# Patient Record
Sex: Female | Born: 1995 | Race: Black or African American | Hispanic: No | Marital: Single | Smoking: Former smoker
Health system: Southern US, Community
[De-identification: ages and names within clinical notes are randomized; demographics above are authoritative.]

## PROBLEM LIST (undated history)

## (undated) ENCOUNTER — Inpatient Hospital Stay (HOSPITAL_COMMUNITY): Payer: Self-pay

## (undated) DIAGNOSIS — A749 Chlamydial infection, unspecified: Secondary | ICD-10-CM

## (undated) DIAGNOSIS — K589 Irritable bowel syndrome without diarrhea: Secondary | ICD-10-CM

## (undated) DIAGNOSIS — J069 Acute upper respiratory infection, unspecified: Secondary | ICD-10-CM

## (undated) HISTORY — PX: NO PAST SURGERIES: SHX2092

---

## 2013-09-08 ENCOUNTER — Encounter (HOSPITAL_COMMUNITY): Payer: Self-pay | Admitting: Emergency Medicine

## 2013-09-08 ENCOUNTER — Emergency Department (HOSPITAL_COMMUNITY)
Admission: EM | Admit: 2013-09-08 | Discharge: 2013-09-09 | Disposition: A | Discharge status: Home / Self Care | Payer: PRIVATE HEALTH INSURANCE | Attending: Emergency Medicine | Admitting: Emergency Medicine

## 2013-09-08 DIAGNOSIS — R1110 Vomiting, unspecified: Secondary | ICD-10-CM

## 2013-09-08 DIAGNOSIS — R6883 Chills (without fever): Secondary | ICD-10-CM | POA: Insufficient documentation

## 2013-09-08 DIAGNOSIS — R197 Diarrhea, unspecified: Secondary | ICD-10-CM | POA: Insufficient documentation

## 2013-09-08 DIAGNOSIS — Z3202 Encounter for pregnancy test, result negative: Secondary | ICD-10-CM | POA: Insufficient documentation

## 2013-09-08 DIAGNOSIS — F17200 Nicotine dependence, unspecified, uncomplicated: Secondary | ICD-10-CM | POA: Insufficient documentation

## 2013-09-08 NOTE — ED Notes (Signed)
Pt states she woke up this morning feeling nauseated and having chills  Pt states she has been vomiting and is unable to hold anything down  Pt states she took a half of a phenergan and was unable to hold that down

## 2013-09-09 LAB — POCT PREGNANCY, URINE: Preg Test, Ur: NEGATIVE

## 2013-09-09 MED ORDER — ONDANSETRON 8 MG PO TBDP: ORAL_TABLET | ORAL | Status: DC

## 2013-09-09 MED ORDER — ONDANSETRON 8 MG PO TBDP
8.0000 mg | ORAL_TABLET | Freq: Once | ORAL | Status: AC
  Administered 2013-09-09: 8 mg via ORAL
  Filled 2013-09-09: qty 1

## 2013-09-09 NOTE — ED Provider Notes (Signed)
CSN: [id number]     Arrival date & time 09/08/13  2033 History   First MD Initiated Contact with Patient 09/09/13 0010     Chief Complaint  Patient presents with  . Emesis  . Chills   (Consider location/radiation/quality/duration/timing/severity/associated sxs/prior Treatment) Patient is a 18 y.o. female presenting with vomiting. The history is provided by the patient. No language interpreter was used.  Emesis Severity:  Moderate Duration:  1 day Timing:  Intermittent Quality:  Stomach contents Progression:  Unchanged Chronicity:  New Recent urination:  Normal Relieved by:  Nothing Worsened by:  Nothing tried Ineffective treatments:  None tried Associated symptoms: diarrhea   Risk factors: no alcohol use     History reviewed. No pertinent past medical history. History reviewed. No pertinent past surgical history. Family History  Problem Relation Age of Onset  . Cancer Other   . Stroke Other   . Diabetes Other   . Hypertension Other    History  Substance Use Topics  . Smoking status: Current Some Day Smoker    Types: Cigarettes  . Smokeless tobacco: Not on file  . Alcohol Use: No   OB History   Grav Para Term Preterm Abortions TAB SAB Ect Mult Living                 Review of Systems  Gastrointestinal: Positive for vomiting and diarrhea.  All other systems reviewed and are negative.    Allergies  Review of patient's allergies indicates no known allergies.  Home Medications   Current Outpatient Rx  Name  Route  Sig  Dispense  Refill  . acetaminophen (TYLENOL) 500 MG tablet   Oral   Take 500 mg by mouth every 6 (six) hours as needed for mild pain or moderate pain.         Marland Kitchen. ibuprofen (ADVIL,MOTRIN) 200 MG tablet   Oral   Take 200 mg by mouth every 6 (six) hours as needed for moderate pain.          BP 111/80  Pulse 101  Temp(Src) 97.9 F (36.6 C) (Oral)  Resp 18  Ht 5\' 5"  (1.651 m)  SpO2 100%  LMP 08/04/2013 Physical Exam  Constitutional:  She is oriented to person, place, and time. She appears well-developed and well-nourished. No distress.  HENT:  Head: Normocephalic and atraumatic.  Mouth/Throat: Oropharynx is clear and moist.  Eyes: Conjunctivae are normal. Pupils are equal, round, and reactive to light.  Neck: Normal range of motion. Neck supple.  Cardiovascular: Normal rate, regular rhythm and intact distal pulses.   Pulmonary/Chest: Effort normal and breath sounds normal. She has no wheezes. She has no rales.  Abdominal: Soft. Bowel sounds are increased. There is no tenderness. There is no rebound and no guarding.  Musculoskeletal: Normal range of motion.  Neurological: She is alert and oriented to person, place, and time.  Skin: Skin is warm and dry.  Psychiatric: She has a normal mood and affect.    ED Course  Procedures (including critical care time) Labs Review Labs Reviewed  POCT PREGNANCY, URINE   Imaging Review No results found.  EKG Interpretation   None       MDM  No diagnosis found. No further vomiting.  No surgical signs on abdominal exam.  Symptoms consistent with viral n/v/d.  Will treat symptomatically.  Return for fever abdominal pain or any concers.      Jasmine AweApril K Aleph Nickson-Rasch, MD 09/09/13 [phone]

## 2013-12-31 ENCOUNTER — Encounter (HOSPITAL_COMMUNITY): Payer: Self-pay | Admitting: Emergency Medicine

## 2013-12-31 DIAGNOSIS — R071 Chest pain on breathing: Secondary | ICD-10-CM | POA: Insufficient documentation

## 2013-12-31 DIAGNOSIS — R51 Headache: Secondary | ICD-10-CM | POA: Insufficient documentation

## 2013-12-31 DIAGNOSIS — M545 Low back pain, unspecified: Secondary | ICD-10-CM | POA: Insufficient documentation

## 2013-12-31 DIAGNOSIS — Z791 Long term (current) use of non-steroidal anti-inflammatories (NSAID): Secondary | ICD-10-CM | POA: Insufficient documentation

## 2013-12-31 DIAGNOSIS — Z3202 Encounter for pregnancy test, result negative: Secondary | ICD-10-CM | POA: Insufficient documentation

## 2013-12-31 DIAGNOSIS — Z79899 Other long term (current) drug therapy: Secondary | ICD-10-CM | POA: Insufficient documentation

## 2013-12-31 DIAGNOSIS — F17200 Nicotine dependence, unspecified, uncomplicated: Secondary | ICD-10-CM | POA: Insufficient documentation

## 2013-12-31 DIAGNOSIS — G8929 Other chronic pain: Secondary | ICD-10-CM | POA: Insufficient documentation

## 2013-12-31 LAB — CBC
HCT: 37.8 % (ref 36.0–46.0)
HEMOGLOBIN: 12.3 g/dL (ref 12.0–15.0)
MCH: 27.1 pg (ref 26.0–34.0)
MCHC: 32.5 g/dL (ref 30.0–36.0)
MCV: 83.3 fL (ref 78.0–100.0)
PLATELETS: 331 10*3/uL (ref 150–400)
RBC: 4.54 MIL/uL (ref 3.87–5.11)
RDW: 13 % (ref 11.5–15.5)
WBC: 8 10*3/uL (ref 4.0–10.5)

## 2013-12-31 LAB — URINALYSIS, ROUTINE W REFLEX MICROSCOPIC
GLUCOSE, UA: NEGATIVE mg/dL
Hgb urine dipstick: NEGATIVE
KETONES UR: 15 mg/dL — AB
NITRITE: NEGATIVE
PH: 6 (ref 5.0–8.0)
Protein, ur: NEGATIVE mg/dL
SPECIFIC GRAVITY, URINE: 1.03 (ref 1.005–1.030)
Urobilinogen, UA: 0.2 mg/dL (ref 0.0–1.0)

## 2013-12-31 LAB — BASIC METABOLIC PANEL
BUN: 7 mg/dL (ref 6–23)
CALCIUM: 8.9 mg/dL (ref 8.4–10.5)
CHLORIDE: 102 meq/L (ref 96–112)
CO2: 24 meq/L (ref 19–32)
Creatinine, Ser: 0.85 mg/dL (ref 0.50–1.10)
GFR calc Af Amer: >90 mL/min (ref >90)
GFR calc non Af Amer: >90 mL/min (ref >90)
GLUCOSE: 92 mg/dL (ref 70–99)
Potassium: 3.5 mEq/L — ABNORMAL LOW (ref 3.7–5.3)
Sodium: 139 mEq/L (ref 137–147)

## 2013-12-31 LAB — URINE MICROSCOPIC-ADD ON

## 2013-12-31 LAB — POC URINE PREG, ED: Preg Test, Ur: NEGATIVE

## 2013-12-31 NOTE — ED Notes (Signed)
Pt and family update on ED flow and process.

## 2013-12-31 NOTE — ED Notes (Signed)
Pt reports right sided flank pain x 1 week worsening. Pain with move, walk, deep inspiration, urination. Pt also reports sharp pains in stomach and headache.

## 2014-01-01 ENCOUNTER — Emergency Department (HOSPITAL_COMMUNITY): Payer: PRIVATE HEALTH INSURANCE

## 2014-01-01 ENCOUNTER — Emergency Department (HOSPITAL_COMMUNITY)
Admission: EM | Admit: 2014-01-01 | Discharge: 2014-01-01 | Disposition: A | Discharge status: Home / Self Care | Payer: PRIVATE HEALTH INSURANCE | Attending: Emergency Medicine | Admitting: Emergency Medicine

## 2014-01-01 DIAGNOSIS — R0789 Other chest pain: Secondary | ICD-10-CM

## 2014-01-01 MED ORDER — IBUPROFEN 800 MG PO TABS
800.0000 mg | ORAL_TABLET | Freq: Once | ORAL | Status: AC
  Administered 2014-01-01: 800 mg via ORAL
  Filled 2014-01-01: qty 1

## 2014-01-01 MED ORDER — OXYCODONE-ACETAMINOPHEN 5-325 MG PO TABS
1.0000 | ORAL_TABLET | Freq: Once | ORAL | Status: AC
  Administered 2014-01-01: 1 via ORAL
  Filled 2014-01-01: qty 1

## 2014-01-01 NOTE — ED Notes (Signed)
Pt reports she is having rt flank pain that comes when she moves, and deep inspiration. Pt states when she sits still she has no pain. Pt denies any injury. Pt requests her results not be discussed in front of family.

## 2014-01-01 NOTE — Discharge Instructions (Signed)
Take Ibuprofen 600-800 mg every 6-8 hours  Return to the emergency department if you develop any changing/worsening condition, coughing up blood, difficulty breathing, fever, abdominal pain, shortness of breath, repeated vomiting, or any other concerns (please read additional information regarding your condition below)   Chest Wall Pain Chest wall pain is pain felt in or around the chest bones and muscles. It may take up to 6 weeks to get better. It may take longer if you are active. Chest wall pain can happen on its own. Other times, things like germs, injury, coughing, or exercise can cause the pain. HOME CARE   Avoid activities that make you tired or cause pain. Try not to use your chest, belly (abdominal), or side muscles. Do not use heavy weights.  Put ice on the sore area.  Put ice in a plastic bag.  Place a towel between your skin and the bag.  Leave the ice on for 15-20 minutes for the first 2 days.  Only take medicine as told by your doctor. GET HELP RIGHT AWAY IF:   You have more pain or are very uncomfortable.  You have a fever.  Your chest pain gets worse.  You have new problems.  You feel sick to your stomach (nauseous) or throw up (vomit).  You start to sweat or feel lightheaded.  You have a cough with mucus (phlegm).  You cough up blood. MAKE SURE YOU:   Understand these instructions.  Will watch your condition.  Will get help right away if you are not doing well or get worse. Document Released: 02/07/2008 Document Revised: 11/13/2011 Document Reviewed: 04/17/2011 [facility] Patient Information 2014 [place].  Abdominal Pain, Adult Many things can cause abdominal pain. Usually, abdominal pain is not caused by a disease and will improve without treatment. It can often be observed and treated at home. Your health care provider will do a physical exam and possibly order blood tests and X-rays to help determine the seriousness of your pain. However, in  many cases, more time must pass before a clear cause of the pain can be found. Before that point, your health care provider may not know if you need more testing or further treatment. HOME CARE INSTRUCTIONS  Monitor your abdominal pain for any changes. The following actions may help to alleviate any discomfort you are experiencing:  Only take over-the-counter or prescription medicines as directed by your health care provider.  Do not take laxatives unless directed to do so by your health care provider.  Try a clear liquid diet (broth, tea, or water) as directed by your health care provider. Slowly move to a bland diet as tolerated. SEEK MEDICAL CARE IF:  You have unexplained abdominal pain.  You have abdominal pain associated with nausea or diarrhea.  You have pain when you urinate or have a bowel movement.  You experience abdominal pain that wakes you in the night.  You have abdominal pain that is worsened or improved by eating food.  You have abdominal pain that is worsened with eating fatty foods. SEEK IMMEDIATE MEDICAL CARE IF:   Your pain does not go away within 2 hours.  You have a fever.  You keep throwing up (vomiting).  Your pain is felt only in portions of the abdomen, such as the right side or the left lower portion of the abdomen.  You pass bloody or black tarry stools. MAKE SURE YOU:  Understand these instructions.   Will watch your condition.   Will get help  right away if you are not doing well or get worse.  Document Released: 05/31/2005 Document Revised: 06/11/2013 Document Reviewed: 04/30/2013 [facility] Patient Information 2014 [place], [place].   Emergency Department Resource Guide 1) Find a Doctor and Pay Out of Pocket Although you won't have to find out who is covered by your insurance plan, it is a good idea to ask around and get recommendations. You will then need to call the office and see if the doctor you have chosen will accept you as a new  patient and what types of options they offer for patients who are self-pay. Some doctors offer discounts or will set up payment plans for their patients who do not have insurance, but you will need to ask so you aren't surprised when you get to your appointment.  2) Contact Your Local Health Department Not all health departments have doctors that can see patients for sick visits, but many do, so it is worth a call to see if yours does. If you don't know where your local health department is, you can check in your phone book. The CDC also has a tool to help you locate your state's health department, and many state websites also have listings of all of their local health departments.  3) Find a Walk-in Clinic If your illness is not likely to be very severe or complicated, you may want to try a walk in clinic. These are popping up all over the country in pharmacies, drugstores, and shopping centers. They're usually staffed by nurse practitioners or physician assistants that have been trained to treat common illnesses and complaints. They're usually fairly quick and inexpensive. However, if you have serious medical issues or chronic medical problems, these are probably not your best option.  No Primary Care Doctor: - Call Health Connect at  [phone] - they can help you locate a primary care doctor that  accepts your insurance, provides certain services, etc. - Physician Referral Service- [phone]  Chronic Pain Problems: Organization         Address  Phone   Notes  [facility]  [phone] Patients need to be referred by their primary care doctor.   Medication Assistance: Organization         Address  Phone   Notes  [facility] Medication [facility] [address] [place] [place], [place] [address] [phone] --Must be a resident of [facility] -- Must have NO insurance coverage whatsoever (no Medicaid/ Medicare, etc.) -- The pt. MUST have a primary  care doctor that directs their care regularly and follows them in the community   MedAssist  [phone]   [employer]  [phone]    Agencies that provide inexpensive medical care: Organization         Address  Phone   Notes  [facility]  [phone]   [facility]    [phone]   [facility] [address] [place] [place], [place] [address] [phone]   [facility] [address] [place], [place] [phone]   Planned Parenthood    [phone]   [facility]    [phone]   [facility]  [address], [place] Phone:  [phone], Fax:  [phone] Hours of Operation:  9 am - 6 pm, M-F.  Also accepts Medicaid/Medicare and self-pay.  [facility]  [address],  [address], [place] Phone: [phone], Fax: [phone]. Hours of Operation:  8:30 am - 5:30 pm, M-F.  Also accepts Medicaid and self-pay.  [facility] [place] [address] [place] Phone: [phone]   [facility] [address] Natasha Bence [place], [place] [phone], Ext. [phone] Mondays & Thursdays: 7-9 AM.  First 15 patients are seen on a first come, first serve basis.    Medicaid-accepting [facility] Providers:  Organization         Address  Phone   Notes  [facility] [address], [place] [phone] Also accepts self-pay patients.  [facility] [address] Laurell [place], [place]  [phone]   [facility] [address], [place] [phone]   [facility] [address], [place] [phone]   Renaye Rakers [address], [place]   [phone] Only accepts [place] Access [place] patients after they have their name applied to their card.   Self-Pay (no insurance) in [facility]:  Organization         Address  Phone   Notes  Sickle Cell Patients, [facility] Internal Medicine [address] [place], [place] [phone]   [facility] [address] [place], [place] [phone]   [facility] [place]  [address], [place] [phone]   [facility]/Dr. Osei-Bonsu  [address] [place] or [address], [place] [phone] Phone number for both [place] and [place] locations is the same.  [facility] [address] [place] [phone]   [facility] [address] [place] or [address] [phone]   [facility] [address], [place] [phone], phone; [phone], fax Sees patients 1st and 3rd Saturday of every month.  Must not qualify for public or private insurance (i.e. Medicaid, Medicare, [place] Health Choice, Veterans' Benefits)  Household income should be no more than 200% of the poverty level The clinic cannot treat you if you are pregnant or think you are pregnant  Sexually transmitted diseases are not treated at the clinic.    Dental Care: Organization         Address  Phone  Notes  [facility] Department of [facility] [address] [place], [place] [phone] Accepts children up to age 62 who are enrolled in [place] or [place] Health Choice; pregnant women with a Medicaid card; and children who have applied for Medicaid or [place] Health Choice, but were declined, whose parents can pay a reduced fee at time of service.  [facility] Department of [facility]  [address], [place] [phone] Accepts children up to age 76 who are enrolled in [place] or [place] Health Choice; pregnant women with a Medicaid card; and children who have applied for Medicaid or [place] Health Choice, but were declined, whose parents can  pay a reduced fee at time of service.  [facility]  [address] [place], [place] [phone] Patients are seen by appointment only. Walk-ins are not accepted. [facility] will see patients 69 years of age and older. Monday - Tuesday (8am-5pm) Most Wednesdays (8:30-5pm) $30 per visit, cash only  [employer] [facility] [employer]  [address], [place] [phone] Patients are seen by appointment only. Walk-ins are not accepted. [facility] will see patients 20 years of age and older. One Wednesday Evening (Monthly: Volunteer Based).  $30 per visit, cash only  [employer] [facility]  [phone] for adults; Children under age 264, call Graduate Pediatric Dentistry at [phone]. Children aged 64-14, please call [phone] to request a pediatric application.  Dental services are provided in all areas of dental care including fillings, crowns and bridges, complete and partial dentures, implants, gum treatment, root canals, and extractions. Preventive care is also provided. Treatment is provided to both adults and children. Patients are selected via a lottery and there is often a waiting list.   [facility] [address], [place]  [phone] [email or web address]   [facility] [address], [place], [place] [phone], Ext. [phone] Second and Fourth Thursday of each month, opens at 6:30 AM; Clinic ends at 9 AM.  Patients are seen on a first-come first-served basis, and a limited number are seen during each clinic.   [facility]  [address], [place], [place] [phone]   Eligibility Requirements You must have lived in [place], [place], or [place] counties for at least the last three months.   You cannot be eligible for state or federal sponsored [place] Cityhealthcare insurance, including CIGNAVeterans Administration, [place]icaid, or [employer]Medicare.   You generally cannot be eligible for healthcare  insurance through your employer.    How to apply: Eligibility screenings are held every Tuesday and Wednesday afternoon from 1:00 pm until 4:00 pm. You do not need an appointment for the interview!  [facility] [address], [place], [place] [phone]   [facility]  [phone]   [facility]  [phone]   [facility]  [phone]    Behavioral Health Resources in the Community: Intensive Outpatient Programs Organization         Address  Phone  Notes  [facility] [address], [place], [place] [phone]   [facility] [address], [place], [place] [phone]   [facility] [address], [place], [place]  [phone]   [facility] [address],  [place], [place] [phone] or [phone]   Substance Abuse Resources Organization         Address  Phone  Notes  [facility]  [phone]   [facility]  [phone]   [facility]  [phone]   [facility]  [phone]   Residential & Outpatient Substance Abuse Program  [phone]   Psychological Services Organization         Address  Phone  Notes  [facility]  [phone]- [phone]   [facility]  [phone]- [phone]   [facility] [address], [place] [phone] or [phone]    Mobile Crisis Teams Organization         Address  Phone  Notes  [facility], Mobile Crisis Care Unit  [phone]   [facility]  [address] [place], [place] [phone]   Doristine LocksSharon DeEsch [address] [place] [phone]    Self-Help/Support Groups Organization         Address  Phone             Notes  [facility] [place] -  variety of support groups  [phone] Call for more information  Narcotics Anonymous (NA),  [facility] [address], [employer] [place]  2 meetings at this location   Residential Sports administrator         Address  Phone  Notes  [facility] [address],    [place]  [phone]   [facility]  [address] [place] [address], [place], [place] [phone]   [facility] [address] [place], [place] [phone] Admissions: 8am-3pm M-F  [facility] [address]    [place], [place] [phone]   [facility] [address] [place], [place], [place] [phone]   [facility] [address],  [place], [place] [phone]   [facility] - Intensive Outpatient [address]urell Joseph[address], [place], [place] [phone]   [facility] ([facility] [address] [place]  [place], [place] [phone] or [phone]   [facility] ([facility]) [address] [place], [place] [phone] Accepts Medicaid  [facility] [address]  [place] [phone] Substance Abuse/Addiction Treatment   [facility] Organization         Address  Phone  Notes  [facility]  [phone]   Angie Fava, PhD [address] Ervin Knack [place], [place]   [phone] or [phone]   [facility] Behavioral   [address] [place], [place] [phone]   [facility] [address] [place], [place] [phone] Insurance/Medicaid/sponsorship through [facility] and Families [address]                                    [place], [place] [phone] Therapy/tele-psych/case  [facility] [address], [place] [phone]    Dr. Lolly Mustache  [phone]   [facility]  [employer] [facility] Dept. 1) [address], [place] 2) [address], [place] 3)  [address], [place] [phone]  [phone]    [facility] Child Abuse Hotline ([phone] or [phone] (After Hours)

## 2014-01-01 NOTE — ED Provider Notes (Signed)
CSN: [id number]     Arrival date & time 12/31/13  1850 History   First MD Initiated Contact with Patient 01/01/14 0038     Chief Complaint  Patient presents with  . Flank Pain   HPI  Barbara Mclean is a 18 y.o. female with no PMH who presents to the ED for evaluation of flank pain. History was provided by the patient. Patient states that she has had right flank pain for the past week. Her pain is located in her upper right flank without radiation. She has no pain at rest. Her pain is exacerbated with movement, deep breathing, and palpation. She did not take anything for pain prior to arrival. She's never had similar pain in the past. No injuries or trauma. She denies any cough, shortness of breath, or chest pain. No history of DVT, PE, clotting disorder, cancer, recent surgery, immobilization, travel, or estrogen supplementation. She also has had intermittent episodes of sharp pain throughout her abdomen, however, denies any abdominal pain currently. She states she has these sharp pains before urination, but denies dysuria. She denies any vaginal bleeding, vaginal discharge, or hematuria. Patient is currently sexually active with no new partners or concerns for STD's. She has chronic lower back pain with no acute changes. She has had alternating chills and feeling warm, but denies any fevers. She also complains of a generalized headache which started during her ED visit. No vision changes, neck pain, weakness, loss of sensation, numbness/tinling.    History reviewed. No pertinent past medical history. History reviewed. No pertinent past surgical history. Family History  Problem Relation Age of Onset  . Cancer Other   . Stroke Other   . Diabetes Other   . Hypertension Other    History  Substance Use Topics  . Smoking status: Current Some Day Smoker -- 0.50 packs/day    Types: Cigarettes  . Smokeless tobacco: Not on file  . Alcohol Use: No   OB History   Grav Para Term Preterm Abortions  TAB SAB Ect Mult Living                 Review of Systems  Constitutional: Negative for fever, chills, diaphoresis, activity change, appetite change and fatigue.  HENT: Negative for congestion, rhinorrhea and sore throat.   Eyes: Negative for photophobia and visual disturbance.  Respiratory: Negative for cough, chest tightness, shortness of breath and wheezing.   Gastrointestinal: Positive for abdominal pain. Negative for nausea, vomiting, diarrhea and constipation.  Genitourinary: Negative for dysuria, vaginal bleeding, vaginal discharge, difficulty urinating, genital sores, vaginal pain, menstrual problem and pelvic pain.  Musculoskeletal: Positive for back pain (chronic). Negative for myalgias and neck pain.  Neurological: Positive for headaches. Negative for dizziness, weakness, light-headedness and numbness.    Allergies  Review of patient's allergies indicates no known allergies.  Home Medications   Prior to Admission medications   Medication Sig Start Date End Date Taking? Authorizing Provider  acetaminophen (TYLENOL) 500 MG tablet Take 500 mg by mouth every 6 (six) hours as needed for mild pain or moderate pain.   Yes Historical Provider, MD  ibuprofen (ADVIL,MOTRIN) 200 MG tablet Take 200 mg by mouth every 6 (six) hours as needed for moderate pain.   Yes Historical Provider, MD   BP 135/88  Pulse 98  Temp(Src) 98.3 F (36.8 C) (Oral)  Resp 16  Ht 5\' 5"  (1.651 m)  Wt 111 lb (50.349 kg)  BMI 18.47 kg/m2  SpO2 100%  LMP 12/21/2013  Filed Vitals:  01/01/14 0131 01/01/14 0232 01/01/14 0233 01/01/14 0254  BP: 120/83 109/72  106/69  Pulse: 80  75 74  Temp: 98.6 F (37 C)   97.7 F (36.5 C)  TempSrc: Oral   Oral  Resp: 18   18  Height:      Weight:      SpO2: 98%  98% 100%    Physical Exam  Nursing note and vitals reviewed. Constitutional: She is oriented to person, place, and time. She appears well-developed and well-nourished. No distress.  HENT:  Head:  Normocephalic and atraumatic.  Right Ear: External ear normal.  Left Ear: External ear normal.  Nose: Nose normal.  Mouth/Throat: Oropharynx is clear and moist. No oropharyngeal exudate.  Eyes: Conjunctivae are normal. Right eye exhibits no discharge. Left eye exhibits no discharge.  Neck: Normal range of motion. Neck supple.  Cardiovascular: Normal rate, regular rhythm, normal heart sounds and intact distal pulses.  Exam reveals no gallop and no friction rub.   No murmur heard. Pulmonary/Chest: Effort normal and breath sounds normal. No respiratory distress. She has no wheezes. She has no rales. She exhibits tenderness.    Focal tenderness to palpation to the right lateral chest wall with no edema, erythema, ecchymosis, crepitus, or lesions. Pain worse with deep breathing and sitting up for exam.   Abdominal: Soft. Bowel sounds are normal. She exhibits no distension and no mass. There is no tenderness. There is no rebound and no guarding.  Musculoskeletal: Normal range of motion. She exhibits no edema and no tenderness.  No LE edema or calf tenderness bilaterally. No tenderness to palpation to the thoracic or lumbar spinous processes throughout.  No tenderness to palpation to the paraspinal muscles throughout. Patient moving all extremities throughout exam. Patient able to ambulate without difficulty or ataxia  Neurological: She is alert and oriented to person, place, and time.  GCS 15.  No focal neurological deficits.  CN 2-12 intact.   Skin: Skin is warm and dry. She is not diaphoretic.    ED Course  Procedures (including critical care time) Labs Review Labs Reviewed  URINALYSIS, ROUTINE W REFLEX MICROSCOPIC - Abnormal; Notable for the following:    Color, Urine AMBER (*)    APPearance CLOUDY (*)    Bilirubin Urine SMALL (*)    Ketones, ur 15 (*)    Leukocytes, UA SMALL (*)    All other components within normal limits  BASIC METABOLIC PANEL - Abnormal; Notable for the following:     Potassium 3.5 (*)    All other components within normal limits  URINE MICROSCOPIC-ADD ON - Abnormal; Notable for the following:    Squamous Epithelial / LPF FEW (*)    All other components within normal limits  CBC  POC URINE PREG, ED    Imaging Review Dg Chest 2 View  01/01/2014   CLINICAL DATA:  Right upper flank pain for 1 week.  EXAM: CHEST  2 VIEW  COMPARISON:  None.  FINDINGS: The lungs are well-aerated and clear. There is no evidence of focal opacification, pleural effusion or pneumothorax.  The heart is normal in size; the mediastinal contour is within normal limits. No acute osseous abnormalities are seen.  IMPRESSION: No acute cardiopulmonary process seen.   Electronically Signed   By: Roanna RaiderJeffery  Chang M.D.   On: 01/01/2014 01:07     EKG Interpretation None      Results for orders placed during the hospital encounter of 01/01/14  URINALYSIS, ROUTINE W REFLEX MICROSCOPIC  Result Value Ref Range   Color, Urine AMBER (*) YELLOW   APPearance CLOUDY (*) CLEAR   Specific Gravity, Urine 1.030  1.005 - 1.030   pH 6.0  5.0 - 8.0   Glucose, UA NEGATIVE  NEGATIVE mg/dL   Hgb urine dipstick NEGATIVE  NEGATIVE   Bilirubin Urine SMALL (*) NEGATIVE   Ketones, ur 15 (*) NEGATIVE mg/dL   Protein, ur NEGATIVE  NEGATIVE mg/dL   Urobilinogen, UA 0.2  0.0 - 1.0 mg/dL   Nitrite NEGATIVE  NEGATIVE   Leukocytes, UA SMALL (*) NEGATIVE  CBC      Result Value Ref Range   WBC 8.0  4.0 - 10.5 K/uL   RBC 4.54  3.87 - 5.11 MIL/uL   Hemoglobin 12.3  12.0 - 15.0 g/dL   HCT 04.5  40.9 - 81.1 %   MCV 83.3  78.0 - 100.0 fL   MCH 27.1  26.0 - 34.0 pg   MCHC 32.5  30.0 - 36.0 g/dL   RDW 91.4  78.2 - 95.6 %   Platelets 331  150 - 400 K/uL  BASIC METABOLIC PANEL      Result Value Ref Range   Sodium 139  137 - 147 mEq/L   Potassium 3.5 (*) 3.7 - 5.3 mEq/L   Chloride 102  96 - 112 mEq/L   CO2 24  19 - 32 mEq/L   Glucose, Bld 92  70 - 99 mg/dL   BUN 7  6 - 23 mg/dL   Creatinine, Ser 2.13   0.50 - 1.10 mg/dL   Calcium 8.9  8.4 - 08.6 mg/dL   GFR calc non Af Amer >90  >90 mL/min   GFR calc Af Amer >90  >90 mL/min  URINE MICROSCOPIC-ADD ON      Result Value Ref Range   Squamous Epithelial / LPF FEW (*) RARE   WBC, UA 3-6  <3 WBC/hpf   RBC / HPF 0-2  <3 RBC/hpf   Bacteria, UA RARE  RARE   Urine-Other MUCOUS PRESENT    POC URINE PREG, ED      Result Value Ref Range   Preg Test, Ur NEGATIVE  NEGATIVE     MDM   Barbara Mclean is a 18 y.o. female with no PMH who presents to the ED for evaluation of flank pain, abdominal pain and headache.   Rechecks  1:40 AM = Pain not improved. Ordering 800 mg Ibuprofen. Repeat abdominal exam benign. No RUQ, flank or abdominal tenderness.  2:05 AM = Pain improved to a 5/10. Pain only worse "when I move, bend, or push on it." Headache resolved.  2:50 AM = Pain resolved. No distress. No headache or abdominal pain.    Etiology of flank pain likely due to to a chest wall pain. Pain has focal reproducible right rib pain. No history of injuries or trauma. No concerning signs of injury on exam. Chest x-ray negative for an acute cardiopulmonary process or rib fracture. Patient does have pain to the right rib area with deep breathing, however, I do not believe this is a pulmonary embolism. PERC negative. No evidence of DVT on exam. Patient had complete resolution of her symptoms throughout her ED visit. No hypoxia, respiratory distress, or tachypnea. Lungs clear to auscultation.    Patient states she has also had intermittent episodes of sharp pains throughout her abdomen. No abdominal pain throughout ED visit. No abdominal tenderness on exam. Abdominal exam benign. Vital signs stable. Headache also resolved. No neurological deficits.  Instructed to follow-up with PCP for continued evaluation and management. Return precautions, discharge instructions, and follow-up was discussed with the patient before discharge.     Discharge Medication List as of  01/01/2014  2:46 AM       Final impressions: 1. Right-sided chest wall pain      Luiz IronJessica Katlin Cheryl Stabenow PA-C   This patient was discussed with Dr. Robyn Haberampos           Heyden Jaber K Lanny Lipkin, PA-C 01/01/14 2317

## 2014-01-02 NOTE — ED Provider Notes (Signed)
Medical screening examination/treatment/procedure(s) were performed by non-physician practitioner and as supervising physician I was immediately available for consultation/collaboration.   EKG Interpretation None        Lyanne CoKevin M Jovanni Rash, MD 01/02/14 [phone]

## 2014-02-05 ENCOUNTER — Encounter: Payer: PRIVATE HEALTH INSURANCE | Admitting: Gastroenterology

## 2014-02-05 ENCOUNTER — Telehealth: Payer: Self-pay | Admitting: Gastroenterology

## 2014-02-05 NOTE — Progress Notes (Signed)
ERROR

## 2014-02-05 NOTE — Telephone Encounter (Signed)
Pt was a no show

## 2014-02-05 NOTE — Telephone Encounter (Signed)
REVIEWED.  

## 2014-02-05 NOTE — Progress Notes (Signed)
   Subjective:    Patient ID: Barbara Mclean, female    DOB: May 08, 1996, 18 y.o.   MRN: [id number] Default, Provider, MD   HPI  No past medical history on file.    Review of Systems     Objective:   Physical Exam        Assessment & Plan:

## 2014-09-04 NOTE — L&D Delivery Note (Signed)
   Delivery Note After reaching complete dilatation and effacement, baby had variable decelerations. We were called to room to assess and patient was +2/+3 station and favorable to push. Baby had fetal bradycardia down to 90 with good variability throughout and intermittent return to baseline. Mother pushed 5 times prior to delivery and baby had nuchal x1, easily reduced at the perineum. Apgars were reassuring and baby was not in distress after delivery.   At 4:30 PM a viable and healthy female was delivered via Vaginal, Spontaneous Delivery (Presentation: ; Occiput Anterior).  APGAR: 8, 9; weight pending  .   Placenta status: Intact, Spontaneous.  Cord: 3 vessels with the following complications: Loose nuchal x1, easily reduced.  Cord pH: N/A  Anesthesia: Epidural  Episiotomy: None Lacerations: Sulcus;Labial Suture Repair: 3.0 vicryl Est. Blood Loss (mL): 161  Mom to postpartum.  Baby to Couplet care / Skin to Skin.  De Hollingshead 04/12/2015, 5:04 PM   OB fellow attestation: Patient is a G1P1001 at [redacted]w[redacted]d who was admitted in active labor with SROM, significant hx of CT and [place] during this pregnancy  I was gloved and present for delivery in its entirety and began initial pushing efforts with the patient.  Second stage of labor progressed, baby delivered after 5 contractions.  Fetal bradycardia was noted during the second stage per above and vacuum was discussed but the patient effectively pushed and infant was delivered quickly. Infant had terminal meconium on delivery.  Complications: none  Lacerations: Right sulcus/labial, repaired with 3-0 vicryl  EBL: 161  Federico Flake, MD 5:56 PM

## 2014-09-21 ENCOUNTER — Other Ambulatory Visit: Payer: Self-pay | Admitting: Obstetrics and Gynecology

## 2014-09-21 DIAGNOSIS — O3680X0 Pregnancy with inconclusive fetal viability, not applicable or unspecified: Secondary | ICD-10-CM

## 2014-09-23 ENCOUNTER — Other Ambulatory Visit: Payer: Self-pay | Admitting: Obstetrics and Gynecology

## 2014-09-23 ENCOUNTER — Encounter: Payer: Self-pay | Admitting: *Deleted

## 2014-09-23 ENCOUNTER — Ambulatory Visit (INDEPENDENT_AMBULATORY_CARE_PROVIDER_SITE_OTHER): Payer: Medicaid Other

## 2014-09-23 DIAGNOSIS — O26841 Uterine size-date discrepancy, first trimester: Secondary | ICD-10-CM

## 2014-09-23 DIAGNOSIS — O3680X0 Pregnancy with inconclusive fetal viability, not applicable or unspecified: Secondary | ICD-10-CM

## 2014-09-23 NOTE — Progress Notes (Signed)
U/S-single IUP with +FCA noted, FHR-161 bpm, active fetus, CRL c/w 12+0wks EDD 04/07/2015, cx appears closed, bilateral adnexa appears WNL, Pt offered NT/IT and is unsure at this time informed patient that we would need to complete the  Screening within the next week if she desires to have the NT/IT

## 2014-09-29 ENCOUNTER — Telehealth: Payer: Self-pay | Admitting: Women's Health

## 2014-09-29 NOTE — Telephone Encounter (Signed)
Pt requesting Integrated screening, pt scheduled for Friday, January 29,2015 per Rodney Boozeasha, Ultrasound Sonographer.

## 2014-09-30 ENCOUNTER — Other Ambulatory Visit: Payer: Self-pay | Admitting: Obstetrics & Gynecology

## 2014-09-30 DIAGNOSIS — Z3682 Encounter for antenatal screening for nuchal translucency: Secondary | ICD-10-CM

## 2014-10-02 ENCOUNTER — Ambulatory Visit (INDEPENDENT_AMBULATORY_CARE_PROVIDER_SITE_OTHER): Payer: Medicaid Other

## 2014-10-02 ENCOUNTER — Other Ambulatory Visit: Payer: Self-pay

## 2014-10-02 DIAGNOSIS — Z3401 Encounter for supervision of normal first pregnancy, first trimester: Secondary | ICD-10-CM

## 2014-10-02 DIAGNOSIS — Z3682 Encounter for antenatal screening for nuchal translucency: Secondary | ICD-10-CM

## 2014-10-02 DIAGNOSIS — Z36 Encounter for antenatal screening of mother: Secondary | ICD-10-CM

## 2014-10-02 NOTE — Progress Notes (Signed)
U/S(13+2wks)-single IUP with +FCA noted, CRL c/w dates, posterior Gr 0 placenta, cx appears closed, bilateral adnexa appears WNL, FHR- 153 bpm, NB present, NT-1.417mm

## 2014-10-05 LAB — MATERNAL SCREEN, INTEGRATED #1
Crown Rump Length: 70.4 mm
GEST. AGE ON COLLECTION DATE: 13 wk
Maternal Age at EDD: 21.5 years
NUMBER OF FETUSES: 1
Nuchal Translucency (NT): 1.7 mm
PAPP-A Value: 803.5 ng/mL
Weight: 122 [lb_av]

## 2014-10-07 ENCOUNTER — Encounter: Payer: Self-pay | Admitting: Advanced Practice Midwife

## 2014-10-07 ENCOUNTER — Ambulatory Visit (INDEPENDENT_AMBULATORY_CARE_PROVIDER_SITE_OTHER): Payer: Medicaid Other | Admitting: Advanced Practice Midwife

## 2014-10-07 VITALS — BP 100/60 | Wt 122.0 lb

## 2014-10-07 DIAGNOSIS — Z118 Encounter for screening for other infectious and parasitic diseases: Secondary | ICD-10-CM

## 2014-10-07 DIAGNOSIS — Z113 Encounter for screening for infections with a predominantly sexual mode of transmission: Secondary | ICD-10-CM

## 2014-10-07 DIAGNOSIS — Z331 Pregnant state, incidental: Secondary | ICD-10-CM

## 2014-10-07 DIAGNOSIS — Z1389 Encounter for screening for other disorder: Secondary | ICD-10-CM

## 2014-10-07 DIAGNOSIS — Z114 Encounter for screening for human immunodeficiency virus [HIV]: Secondary | ICD-10-CM

## 2014-10-07 DIAGNOSIS — Z1159 Encounter for screening for other viral diseases: Secondary | ICD-10-CM

## 2014-10-07 DIAGNOSIS — Z130 Encounter for screening for diseases of the blood and blood-forming organs and certain disorders involving the immune mechanism: Secondary | ICD-10-CM

## 2014-10-07 DIAGNOSIS — O98819 Other maternal infectious and parasitic diseases complicating pregnancy, unspecified trimester: Secondary | ICD-10-CM

## 2014-10-07 DIAGNOSIS — Z3401 Encounter for supervision of normal first pregnancy, first trimester: Secondary | ICD-10-CM

## 2014-10-07 DIAGNOSIS — A749 Chlamydial infection, unspecified: Secondary | ICD-10-CM

## 2014-10-07 DIAGNOSIS — Z0283 Encounter for blood-alcohol and blood-drug test: Secondary | ICD-10-CM

## 2014-10-07 DIAGNOSIS — Z0184 Encounter for antibody response examination: Secondary | ICD-10-CM

## 2014-10-07 DIAGNOSIS — Z1371 Encounter for nonprocreative screening for genetic disease carrier status: Secondary | ICD-10-CM

## 2014-10-07 LAB — OB RESULTS CONSOLE RUBELLA ANTIBODY, IGM: Rubella: IMMUNE

## 2014-10-07 LAB — POCT URINALYSIS DIPSTICK
Blood, UA: NEGATIVE
GLUCOSE UA: NEGATIVE
KETONES UA: NEGATIVE
NITRITE UA: NEGATIVE
PROTEIN UA: NEGATIVE

## 2014-10-07 LAB — OB RESULTS CONSOLE RPR: RPR: NONREACTIVE

## 2014-10-07 LAB — OB RESULTS CONSOLE GC/CHLAMYDIA
Chlamydia: POSITIVE
Gonorrhea: NEGATIVE

## 2014-10-07 LAB — OB RESULTS CONSOLE ABO/RH: RH Type: POSITIVE

## 2014-10-07 NOTE — Progress Notes (Signed)
  Subjective:    Barbara Mclean is a G1P0 5469w0d being seen today for her first obstetrical visit.  Her obstetrical history is significant for none.  Pregnancy history fully reviewed.  Patient reports nausea, getting better. Working at [employer].    There were no vitals filed for this visit.  HISTORY: OB History  Gravida Para Term Preterm AB SAB TAB Ectopic Multiple Living  1             # Outcome Date GA Lbr Len/2nd Weight Sex Delivery Anes PTL Lv  1 Current              No past medical history on file. Past Surgical History  Procedure Laterality Date  . No past surgeries     Family History  Problem Relation Age of Onset  . Cancer Other   . Stroke Other   . Diabetes Other   . Hypertension Other      Exam                                           Skin: normal coloration and turgor, no rashes    Neurologic: oriented, normal, normal mood   Extremities: normal strength, tone, and muscle mass   HEENT PERRLA   Mouth/Teeth mucous membranes moist, normal dentition   Neck supple and no masses   Cardiovascular: regular rate and rhythm   Respiratory:  appears well, vitals normal, no respiratory distress, acyanotic   Abdomen: soft, non-tender;  FHR: 150          Assessment:    Pregnancy: G1P0 Patient Active Problem List   Diagnosis Date Noted  . Supervision of normal first pregnancy 10/07/2014        Plan:     Initial labs drawn. Continue prenatal vitamins  Problem list reviewed and updated  Reviewed n/v relief measures and warning s/s to report  Reviewed recommended weight gain based on pre-gravid BMI  Encouraged well-balanced diet Genetic Screening discussed Integrated Screen: requested.  Ultrasound discussed; fetal survey: requested.  Follow up in 3 weeks for Pap smear and 2nd IT.  CRESENZO-DISHMAN,Karuna Balducci 10/07/2014

## 2014-10-07 NOTE — Patient Instructions (Signed)
First Trimester of Pregnancy The first trimester of pregnancy is from week 1 until the end of week 12 (months 1 through 3). A week after a sperm fertilizes an egg, the egg will implant on the wall of the uterus. This embryo will begin to develop into a baby. Genes from you and your partner are forming the baby. The female genes determine whether the baby is a boy or a girl. At 6-8 weeks, the eyes and face are formed, and the heartbeat can be seen on ultrasound. At the end of 12 weeks, all the baby's organs are formed.  Now that you are pregnant, you will want to do everything you can to have a healthy baby. Two of the most important things are to get good prenatal care and to follow your health care provider's instructions. Prenatal care is all the medical care you receive before the baby's birth. This care will help prevent, find, and treat any problems during the pregnancy and childbirth. BODY CHANGES Your body goes through many changes during pregnancy. The changes vary from woman to woman.   You may gain or lose a couple of pounds at first.  You may feel sick to your stomach (nauseous) and throw up (vomit). If the vomiting is uncontrollable, call your health care provider.  You may tire easily.  You may develop headaches that can be relieved by medicines approved by your health care provider.  You may urinate more often. Painful urination may mean you have a bladder infection.  You may develop heartburn as a result of your pregnancy.  You may develop constipation because certain hormones are causing the muscles that push waste through your intestines to slow down.  You may develop hemorrhoids or swollen, bulging veins (varicose veins).  Your breasts may begin to grow larger and become tender. Your nipples may stick out more, and the tissue that surrounds them (areola) may become darker.  Your gums may bleed and may be sensitive to brushing and flossing.  Dark spots or blotches (chloasma,  mask of pregnancy) may develop on your face. This will likely fade after the baby is born.  Your menstrual periods will stop.  You may have a loss of appetite.  You may develop cravings for certain kinds of food.  You may have changes in your emotions from day to day, such as being excited to be pregnant or being concerned that something may go wrong with the pregnancy and baby.  You may have more vivid and strange dreams.  You may have changes in your hair. These can include thickening of your hair, rapid growth, and changes in texture. Some women also have hair loss during or after pregnancy, or hair that feels dry or thin. Your hair will most likely return to normal after your baby is born. WHAT TO EXPECT AT YOUR PRENATAL VISITS During a routine prenatal visit:  You will be weighed to make sure you and the baby are growing normally.  Your blood pressure will be taken.  Your abdomen will be measured to track your baby's growth.  The fetal heartbeat will be listened to starting around week 10 or 12 of your pregnancy.  Test results from any previous visits will be discussed. Your health care provider may ask you:  How you are feeling.  If you are feeling the baby move.  If you have had any abnormal symptoms, such as leaking fluid, bleeding, severe headaches, or abdominal cramping.  If you have any questions. Other tests   that may be performed during your first trimester include:  Blood tests to find your blood type and to check for the presence of any previous infections. They will also be used to check for low iron levels (anemia) and Rh antibodies. Later in the pregnancy, blood tests for diabetes will be done along with other tests if problems develop.  Urine tests to check for infections, diabetes, or protein in the urine.  An ultrasound to confirm the proper growth and development of the baby.  An amniocentesis to check for possible genetic problems.  Fetal screens for  spina bifida and Down syndrome.  You may need other tests to make sure you and the baby are doing well. HOME CARE INSTRUCTIONS  Medicines  Follow your health care provider's instructions regarding medicine use. Specific medicines may be either safe or unsafe to take during pregnancy.  Take your prenatal vitamins as directed.  If you develop constipation, try taking a stool softener if your health care provider approves. Diet  Eat regular, well-balanced meals. Choose a variety of foods, such as meat or vegetable-based protein, fish, milk and low-fat dairy products, vegetables, fruits, and whole grain breads and cereals. Your health care provider will help you determine the amount of weight gain that is right for you.  Avoid raw meat and uncooked cheese. These carry germs that can cause birth defects in the baby.  Eating four or five small meals rather than three large meals a day may help relieve nausea and vomiting. If you start to feel nauseous, eating a few soda crackers can be helpful. Drinking liquids between meals instead of during meals also seems to help nausea and vomiting.  If you develop constipation, eat more high-fiber foods, such as fresh vegetables or fruit and whole grains. Drink enough fluids to keep your urine clear or pale yellow. Activity and Exercise  Exercise only as directed by your health care provider. Exercising will help you:  Control your weight.  Stay in shape.  Be prepared for labor and delivery.  Experiencing pain or cramping in the lower abdomen or low back is a good sign that you should stop exercising. Check with your health care provider before continuing normal exercises.  Try to avoid standing for long periods of time. Move your legs often if you must stand in one place for a long time.  Avoid heavy lifting.  Wear low-heeled shoes, and practice good posture.  You may continue to have sex unless your health care provider directs you  otherwise. Relief of Pain or Discomfort  Wear a good support bra for breast tenderness.   Take warm sitz baths to soothe any pain or discomfort caused by hemorrhoids. Use hemorrhoid cream if your health care provider approves.   Rest with your legs elevated if you have leg cramps or low back pain.  If you develop varicose veins in your legs, wear support hose. Elevate your feet for 15 minutes, 3-4 times a day. Limit salt in your diet. Prenatal Care  Schedule your prenatal visits by the twelfth week of pregnancy. They are usually scheduled monthly at first, then more often in the last 2 months before delivery.  Write down your questions. Take them to your prenatal visits.  Keep all your prenatal visits as directed by your health care provider. Safety  Wear your seat belt at all times when driving.  Make a list of emergency phone numbers, including numbers for family, friends, the hospital, and police and fire departments. General Tips    Ask your health care provider for a referral to a local prenatal education class. Begin classes no later than at the beginning of month 6 of your pregnancy.  Ask for help if you have counseling or nutritional needs during pregnancy. Your health care provider can offer advice or refer you to specialists for help with various needs.  Do not use hot tubs, steam rooms, or saunas.  Do not douche or use tampons or scented sanitary pads.  Do not cross your legs for long periods of time.  Avoid cat litter boxes and soil used by cats. These carry germs that can cause birth defects in the baby and possibly loss of the fetus by miscarriage or stillbirth.  Avoid all smoking, herbs, alcohol, and medicines not prescribed by your health care provider. Chemicals in these affect the formation and growth of the baby.  Schedule a dentist appointment. At home, brush your teeth with a soft toothbrush and be gentle when you floss. SEEK MEDICAL CARE IF:   You have  dizziness.  You have mild pelvic cramps, pelvic pressure, or nagging pain in the abdominal area.  You have persistent nausea, vomiting, or diarrhea.  You have a bad smelling vaginal discharge.  You have pain with urination.  You notice increased swelling in your face, hands, legs, or ankles. SEEK IMMEDIATE MEDICAL CARE IF:   You have a fever.  You are leaking fluid from your vagina.  You have spotting or bleeding from your vagina.  You have severe abdominal cramping or pain.  You have rapid weight gain or loss.  You vomit blood or material that looks like coffee grounds.  You are exposed to German measles and have never had them.  You are exposed to fifth disease or chickenpox.  You develop a severe headache.  You have shortness of breath.  You have any kind of trauma, such as from a fall or a car accident. Document Released: 08/15/2001 Document Revised: 01/05/2014 Document Reviewed: 07/01/2013 [employer] Patient Information 2015 [employer]. This information is not intended to replace advice given to you by your health care provider. Make sure you discuss any questions you have with your health care provider.  

## 2014-10-09 LAB — GC/CHLAMYDIA PROBE AMP
Chlamydia trachomatis, NAA: POSITIVE — AB
Neisseria gonorrhoeae by PCR: NEGATIVE

## 2014-10-09 LAB — URINE CULTURE: Organism ID, Bacteria: NO GROWTH

## 2014-10-13 DIAGNOSIS — O98819 Other maternal infectious and parasitic diseases complicating pregnancy, unspecified trimester: Secondary | ICD-10-CM

## 2014-10-13 DIAGNOSIS — A749 Chlamydial infection, unspecified: Secondary | ICD-10-CM | POA: Insufficient documentation

## 2014-10-13 MED ORDER — AZITHROMYCIN 500 MG PO TABS: 1000.0000 mg | ORAL_TABLET | Freq: Every day | ORAL | Status: DC

## 2014-10-13 NOTE — Addendum Note (Signed)
Addended by: Jacklyn ShellRESENZO-DISHMON, Nadie Fiumara on: 10/13/2014 11:12 AM   Modules accepted: Orders, Medications

## 2014-10-14 LAB — CBC
HCT: 35.3 % (ref 34.0–46.6)
Hemoglobin: 11.4 g/dL (ref 11.1–15.9)
MCH: 27.1 pg (ref 26.6–33.0)
MCHC: 32.3 g/dL (ref 31.5–35.7)
MCV: 84 fL (ref 79–97)
PLATELETS: 319 10*3/uL (ref 150–379)
RBC: 4.2 x10E6/uL (ref 3.77–5.28)
RDW: 14.4 % (ref 12.3–15.4)
WBC: 5.3 10*3/uL (ref 3.4–10.8)

## 2014-10-14 LAB — SICKLE CELL SCREEN: Sickle Cell Screen: NEGATIVE

## 2014-10-14 LAB — URINALYSIS, ROUTINE W REFLEX MICROSCOPIC
Bilirubin, UA: NEGATIVE
GLUCOSE, UA: NEGATIVE
Ketones, UA: NEGATIVE
Leukocytes, UA: NEGATIVE
Nitrite, UA: NEGATIVE
Protein, UA: NEGATIVE
RBC, UA: NEGATIVE
SPEC GRAV UA: 1.023 (ref 1.005–1.030)
Urobilinogen, Ur: 0.2 mg/dL (ref 0.2–1.0)
pH, UA: 8 — ABNORMAL HIGH (ref 5.0–7.5)

## 2014-10-14 LAB — PMP SCREEN PROFILE (10S), URINE
Amphetamine Screen, Ur: NEGATIVE ng/mL
BARBITURATE SCRN UR: NEGATIVE ng/mL
Benzodiazepine Screen, Urine: NEGATIVE ng/mL
Cannabinoids Ur Ql Scn: POSITIVE ng/mL
Cocaine(Metab.)Screen, Urine: NEGATIVE ng/mL
Creatinine(Crt), U: 111.4 mg/dL (ref 20.0–300.0)
Methadone Scn, Ur: NEGATIVE ng/mL
Opiate Scrn, Ur: NEGATIVE ng/mL
Oxycodone+Oxymorphone Ur Ql Scn: NEGATIVE ng/mL
PCP Scrn, Ur: NEGATIVE ng/mL
Ph of Urine: 8.6 (ref 4.5–8.9)
Propoxyphene, Screen: NEGATIVE ng/mL

## 2014-10-14 LAB — CYSTIC FIBROSIS MUTATION 97: GENE DIS ANAL CARRIER INTERP BLD/T-IMP: NOT DETECTED

## 2014-10-14 LAB — VARICELLA ZOSTER ANTIBODY, IGG: Varicella zoster IgG: 303 index (ref >165)

## 2014-10-14 LAB — ABO/RH: Rh Factor: POSITIVE

## 2014-10-14 LAB — RUBELLA SCREEN: RUBELLA: 4.11 {index} (ref >0.99)

## 2014-10-14 LAB — RPR: RPR Ser Ql: NONREACTIVE

## 2014-10-14 LAB — HIV ANTIBODY (ROUTINE TESTING W REFLEX): HIV SCREEN 4TH GENERATION: NONREACTIVE

## 2014-10-14 LAB — ANTIBODY SCREEN: Antibody Screen: NEGATIVE

## 2014-10-14 LAB — HEPATITIS B SURFACE ANTIGEN: HEP B S AG: NEGATIVE

## 2014-10-28 ENCOUNTER — Encounter: Payer: Self-pay | Admitting: Advanced Practice Midwife

## 2014-10-28 ENCOUNTER — Ambulatory Visit (INDEPENDENT_AMBULATORY_CARE_PROVIDER_SITE_OTHER): Payer: Medicaid Other | Admitting: Advanced Practice Midwife

## 2014-10-28 VITALS — BP 112/76 | HR 60 | Wt 125.0 lb

## 2014-10-28 DIAGNOSIS — Z3402 Encounter for supervision of normal first pregnancy, second trimester: Secondary | ICD-10-CM

## 2014-10-28 DIAGNOSIS — Z1389 Encounter for screening for other disorder: Secondary | ICD-10-CM

## 2014-10-28 DIAGNOSIS — A749 Chlamydial infection, unspecified: Secondary | ICD-10-CM

## 2014-10-28 DIAGNOSIS — Z331 Pregnant state, incidental: Secondary | ICD-10-CM

## 2014-10-28 DIAGNOSIS — Z3682 Encounter for antenatal screening for nuchal translucency: Secondary | ICD-10-CM

## 2014-10-28 DIAGNOSIS — Z363 Encounter for antenatal screening for malformations: Secondary | ICD-10-CM

## 2014-10-28 DIAGNOSIS — K589 Irritable bowel syndrome without diarrhea: Secondary | ICD-10-CM | POA: Insufficient documentation

## 2014-10-28 DIAGNOSIS — O98819 Other maternal infectious and parasitic diseases complicating pregnancy, unspecified trimester: Secondary | ICD-10-CM

## 2014-10-28 LAB — POCT URINALYSIS DIPSTICK
Blood, UA: NEGATIVE
Glucose, UA: NEGATIVE
KETONES UA: NEGATIVE
Nitrite, UA: NEGATIVE

## 2014-10-28 MED ORDER — DICYCLOMINE HCL 20 MG PO TABS
20.0000 mg | ORAL_TABLET | Freq: Three times a day (TID) | ORAL | Status: DC
Start: 2014-10-28 — End: 2015-01-27

## 2014-10-28 NOTE — Progress Notes (Signed)
G1P0 [redacted]w[redacted]d Estimated Date of Delivery: 04/07/15  Blood pressure 112/76, pulse 60, weight 125 lb (56.7 kg), last menstrual period 06/22/2014.   BP weight and urine results all reviewed and noted.  Please refer to the flow sheet for the fundal height and fetal heart rate documentation.  Pt never returned calls to inform her of + THC and + chlamydia.  Discussed both. PT has not smoked pot in a few weeks.  Patient reports good fetal movement, denies any bleeding and no rupture of membranes symptoms or regular contractions. Patient C/O dizzy spell with cold sweat.  Sounds like hypoglycemia.  Also c/O left ear "popping" from time to time.  Tympanic membrane bulging, no erythema.  Try steaming ear/decongestant C/O IBS sx (diarrhea and constipation) this week. Hx IBS, bentyl helped.   All questions were answered.  Plan:  Continued routine obstetrical care, Tips to reduce hypoglycemic discussed.  Rx Bentyl 20mg  TID Pick up rx for Azithromycin Follow up in 2 weeks for OB appointment, anatomy scan

## 2014-10-30 LAB — MATERNAL SCREEN, INTEGRATED #2
AFP MoM: 1.09
Alpha-Fetoprotein: 46.3 ng/mL
Crown Rump Length: 70.4 mm
DIA MoM: 0.53
DIA VALUE: 106.3 pg/mL
Estriol, Unconjugated: 0.97 ng/mL
GEST. AGE ON COLLECTION DATE: 13 wk
Gestational Age: 16.7 weeks
HCG MOM: 0.54
HCG VALUE: 17.8 [IU]/mL
Maternal Age at EDD: 21.5 years
Nuchal Translucency (NT): 1.7 mm
Nuchal Translucency MoM: 1.11
Number of Fetuses: 1
PAPP-A MoM: 0.52
PAPP-A VALUE: 803.5 ng/mL
Test Results:: NEGATIVE
WEIGHT: 122 [lb_av]
Weight: 125 [lb_av]
uE3 MoM: 0.95

## 2014-11-03 ENCOUNTER — Telehealth: Payer: Self-pay | Admitting: *Deleted

## 2014-11-03 NOTE — Telephone Encounter (Signed)
Requesting what pt was given for treatment of + CHL. Azithromycin 1,000 mg po daily e-scribed on 10/13/2014.

## 2014-11-06 ENCOUNTER — Telehealth: Payer: Self-pay | Admitting: Advanced Practice Midwife

## 2014-11-06 DIAGNOSIS — J069 Acute upper respiratory infection, unspecified: Secondary | ICD-10-CM

## 2014-11-06 HISTORY — DX: Acute upper respiratory infection, unspecified: J06.9

## 2014-11-06 NOTE — Telephone Encounter (Signed)
Has sore throat, ears popping, legs hurting, go to MAU to be evaluated

## 2014-11-06 NOTE — Telephone Encounter (Signed)
Pt states she continues to have the "popping in left ear and dizziness, no fever." She also states she saw Cathie BeamsFran Cresenzo-Dishmon, CNM on 10/28/2014 and she felt the pt BS were dropping causing the dizziness and had fluid on her left ear. Pt states she tried Fran's recommendation but not helping.

## 2014-11-07 ENCOUNTER — Inpatient Hospital Stay (HOSPITAL_COMMUNITY)
Admission: AD | Admit: 2014-11-07 | Discharge: 2014-11-07 | Disposition: A | Discharge status: Home / Self Care | Payer: Medicaid Other | Source: Ambulatory Visit | Attending: Obstetrics & Gynecology | Admitting: Obstetrics & Gynecology

## 2014-11-07 ENCOUNTER — Encounter (HOSPITAL_COMMUNITY): Payer: Self-pay | Admitting: *Deleted

## 2014-11-07 ENCOUNTER — Telehealth: Payer: Self-pay | Admitting: Physician Assistant

## 2014-11-07 DIAGNOSIS — J069 Acute upper respiratory infection, unspecified: Secondary | ICD-10-CM | POA: Diagnosis not present

## 2014-11-07 DIAGNOSIS — A5901 Trichomonal vulvovaginitis: Secondary | ICD-10-CM | POA: Diagnosis not present

## 2014-11-07 DIAGNOSIS — A749 Chlamydial infection, unspecified: Secondary | ICD-10-CM

## 2014-11-07 DIAGNOSIS — J101 Influenza due to other identified influenza virus with other respiratory manifestations: Secondary | ICD-10-CM | POA: Insufficient documentation

## 2014-11-07 DIAGNOSIS — M545 Low back pain: Secondary | ICD-10-CM | POA: Diagnosis present

## 2014-11-07 DIAGNOSIS — H6691 Otitis media, unspecified, right ear: Secondary | ICD-10-CM

## 2014-11-07 DIAGNOSIS — Z87891 Personal history of nicotine dependence: Secondary | ICD-10-CM | POA: Insufficient documentation

## 2014-11-07 DIAGNOSIS — A64 Unspecified sexually transmitted disease: Secondary | ICD-10-CM | POA: Insufficient documentation

## 2014-11-07 DIAGNOSIS — A5602 Chlamydial vulvovaginitis: Secondary | ICD-10-CM | POA: Diagnosis not present

## 2014-11-07 DIAGNOSIS — O98319 Other infections with a predominantly sexual mode of transmission complicating pregnancy, unspecified trimester: Secondary | ICD-10-CM

## 2014-11-07 DIAGNOSIS — O9989 Other specified diseases and conditions complicating pregnancy, childbirth and the puerperium: Secondary | ICD-10-CM | POA: Diagnosis not present

## 2014-11-07 DIAGNOSIS — Z3A18 18 weeks gestation of pregnancy: Secondary | ICD-10-CM | POA: Insufficient documentation

## 2014-11-07 DIAGNOSIS — O98312 Other infections with a predominantly sexual mode of transmission complicating pregnancy, second trimester: Secondary | ICD-10-CM | POA: Diagnosis not present

## 2014-11-07 DIAGNOSIS — O98819 Other maternal infectious and parasitic diseases complicating pregnancy, unspecified trimester: Secondary | ICD-10-CM

## 2014-11-07 DIAGNOSIS — N949 Unspecified condition associated with female genital organs and menstrual cycle: Secondary | ICD-10-CM

## 2014-11-07 HISTORY — DX: Chlamydial infection, unspecified: A74.9

## 2014-11-07 HISTORY — DX: Acute upper respiratory infection, unspecified: J06.9

## 2014-11-07 LAB — URINALYSIS, ROUTINE W REFLEX MICROSCOPIC
Bilirubin Urine: NEGATIVE
GLUCOSE, UA: NEGATIVE mg/dL
HGB URINE DIPSTICK: NEGATIVE
Ketones, ur: NEGATIVE mg/dL
Nitrite: NEGATIVE
PH: 6 (ref 5.0–8.0)
Protein, ur: NEGATIVE mg/dL
Specific Gravity, Urine: >1.03 — ABNORMAL HIGH (ref 1.005–1.030)
Urobilinogen, UA: 0.2 mg/dL (ref 0.0–1.0)

## 2014-11-07 LAB — URINE MICROSCOPIC-ADD ON

## 2014-11-07 LAB — INFLUENZA PANEL BY PCR (TYPE A & B)
H1N1FLUPCR: DETECTED — AB
INFLBPCR: NEGATIVE
Influenza A By PCR: POSITIVE — AB

## 2014-11-07 MED ORDER — AZITHROMYCIN 250 MG PO TABS
1000.0000 mg | ORAL_TABLET | Freq: Once | ORAL | Status: AC
  Administered 2014-11-07: 1000 mg via ORAL
  Filled 2014-11-07: qty 4

## 2014-11-07 MED ORDER — OSELTAMIVIR PHOSPHATE 75 MG PO CAPS: 75.0000 mg | ORAL_CAPSULE | Freq: Two times a day (BID) | ORAL | Status: DC

## 2014-11-07 MED ORDER — AMOXICILLIN 500 MG PO CAPS: 500.0000 mg | ORAL_CAPSULE | Freq: Three times a day (TID) | ORAL | Status: DC

## 2014-11-07 MED ORDER — HYDROCODONE-HOMATROPINE 5-1.5 MG/5ML PO SYRP: 5.0000 mL | ORAL_SOLUTION | Freq: Once | ORAL | Status: DC

## 2014-11-07 MED ORDER — HYDROCOD POLST-CHLORPHEN POLST 10-8 MG/5ML PO LQCR
5.0000 mL | Freq: Once | ORAL | Status: AC
  Administered 2014-11-07: 5 mL via ORAL
  Filled 2014-11-07: qty 5

## 2014-11-07 MED ORDER — HYDROCOD POLST-CHLORPHEN POLST 10-8 MG/5ML PO LQCR: 5.0000 mL | Freq: Two times a day (BID) | ORAL | Status: DC | PRN

## 2014-11-07 MED ORDER — METRONIDAZOLE 500 MG PO TABS: 2000.0000 mg | ORAL_TABLET | Freq: Once | ORAL | Status: DC

## 2014-11-07 NOTE — MAU Note (Signed)
My legs and back hurts, my L ear popping and doctor said i have fld in ear. Sore throat and headache. Sometimes dizzy

## 2014-11-07 NOTE — Progress Notes (Signed)
Written and verbal d/c instructions given and understanding voiced. 

## 2014-11-07 NOTE — Telephone Encounter (Signed)
Pt called.  Identity confirmed.  She notes she still feels terrible.  NO temp taken.  She is informed of positive Influenza A status and positive H1N1 status.   Will give Tamiflu despite the fact she has been unwell greater than 48 hours as it is unclear when flu began.   Pt advised to push po fluids, use tylenol prn and follow up in clinic as needed.   Patient may return to MAU as needed or if her condition were to change or worsen

## 2014-11-07 NOTE — MAU Provider Note (Signed)
History     CSN: [id number]  Arrival date and time: 11/07/14 0101   None     Chief Complaint  Patient presents with  . Back Pain  . Leg Pain  . URI   HPI    Ms. Barbara Mclean is a 19 y.o. female G1P0 at 7570w3d with multiple complaints.   She has had URI symptoms for 2 weeks including popping in her left ear, headache, sore throat and cough. She has not checked her temp at home however feels like she has the chills for several days.   She has had lower back pain and bilateral leg pain; the pain is all the time, however the pain is worse with movement.   She has been seen by her Dr. For ear popping and was told that she has fluid in her ears.   OB History    Gravida Para Term Preterm AB TAB SAB Ectopic Multiple Living   1               No past medical history on file.  Past Surgical History  Procedure Laterality Date  . No past surgeries      Family History  Problem Relation Age of Onset  . Anemia Mother   . Diabetes Maternal Grandmother   . Hypertension Maternal Grandmother   . Cancer Maternal Grandfather     bone    History  Substance Use Topics  . Smoking status: Former Smoker -- 0.50 packs/day    Types: Cigarettes  . Smokeless tobacco: Never Used  . Alcohol Use: No    Allergies: No Known Allergies  Prescriptions prior to admission  Medication Sig Dispense Refill Last Dose  . acetaminophen (TYLENOL) 500 MG tablet Take 500 mg by mouth every 6 (six) hours as needed for mild pain or moderate pain.   Not Taking  . azithromycin (ZITHROMAX) 500 MG tablet Take 2 tablets (1,000 mg total) by mouth daily. (Patient not taking: Reported on 10/28/2014) 2 tablet 1 Not Taking  . dicyclomine (BENTYL) 20 MG tablet Take 1 tablet (20 mg total) by mouth 3 (three) times daily before meals. 90 tablet 2   . Pediatric Multiple Vit-C-FA (FLINSTONES GUMMIES OMEGA-3 DHA) CHEW Chew by mouth.   Taking   Results for orders placed or performed during the hospital encounter of  11/07/14 (from the past 48 hour(s))  Urinalysis, Routine w reflex microscopic     Status: Abnormal   Collection Time: 11/07/14  1:20 AM  Result Value Ref Range   Color, Urine YELLOW YELLOW   APPearance CLEAR CLEAR   Specific Gravity, Urine >1.030 (H) 1.005 - 1.030   pH 6.0 5.0 - 8.0   Glucose, UA NEGATIVE NEGATIVE mg/dL   Hgb urine dipstick NEGATIVE NEGATIVE   Bilirubin Urine NEGATIVE NEGATIVE   Ketones, ur NEGATIVE NEGATIVE mg/dL   Protein, ur NEGATIVE NEGATIVE mg/dL   Urobilinogen, UA 0.2 0.0 - 1.0 mg/dL   Nitrite NEGATIVE NEGATIVE   Leukocytes, UA MODERATE (A) NEGATIVE  Urine microscopic-add on     Status: Abnormal   Collection Time: 11/07/14  1:20 AM  Result Value Ref Range   Squamous Epithelial / LPF FEW (A) RARE   WBC, UA 7-10 <3 WBC/hpf   RBC / HPF 3-6 <3 RBC/hpf   Bacteria, UA FEW (A) RARE   Urine-Other TRICHOMONAS PRESENT   Influenza panel by PCR (type A & B, H1N1)     Status: Abnormal   Collection Time: 11/07/14  1:50 AM  Result Value  Ref Range   Influenza A By PCR POSITIVE (A) NEGATIVE   Influenza B By PCR NEGATIVE NEGATIVE   H1N1 flu by pcr DETECTED (A) NOT DETECTED    Comment:        The Xpert Flu assay (FDA approved for nasal aspirates or washes and nasopharyngeal swab specimens), is intended as an aid in the diagnosis of influenza and should not be used as a sole basis for treatment. Performed at [facility]     Review of Systems  Constitutional: Positive for chills. Negative for fever.  HENT: Positive for ear pain (Left hear with popping ) and sore throat.   Respiratory: Positive for cough.   Neurological: Positive for headaches.   Physical Exam   Blood pressure 126/71, pulse 98, temperature 99.2 F (37.3 C), resp. rate 20, height  (1.651 m), weight 57.879 kg (127 lb 9.6 oz), last menstrual period 06/22/2014, SpO2 98 %.  Physical Exam  Constitutional: She is oriented to person, place, and time. She appears well-developed and  well-nourished.  Non-toxic appearance. She has a sickly appearance. She appears ill. No distress.  HENT:  Right Ear: Tympanic membrane is erythematous and bulging.  Left Ear: Tympanic membrane is bulging. Tympanic membrane is not erythematous.  Nose: Right sinus exhibits maxillary sinus tenderness and frontal sinus tenderness. Left sinus exhibits maxillary sinus tenderness and frontal sinus tenderness.  Mouth/Throat: Mucous membranes are dry. Posterior oropharyngeal erythema present. No oropharyngeal exudate, posterior oropharyngeal edema or tonsillar abscesses.  Eyes: Pupils are equal, round, and reactive to light.  Neck: Neck supple.  Cardiovascular: Normal rate and normal heart sounds.   Respiratory: Effort normal and breath sounds normal. No respiratory distress. She has no wheezes.  Musculoskeletal: Normal range of motion.  Neurological: She is alert and oriented to person, place, and time.  Skin: Skin is warm. She is not diaphoretic.  Psychiatric: Her behavior is normal.    MAU Course  Procedures  None  MDM Temp 99.0  Influenza swab collected tussionex 5 ml given in MAU  Patient given 1 gram of azithromycin for positive GC  Partner given RX for flagyl and azithromycin; allergies confirmed.   Assessment and Plan   A:  Round ligament pain Otitis media; right  Upper respiratory infection  Flu like symptoms  Chlamydia  Trichomonas    P;  Discharge home in stable condition  Aomxicillin 500 mg PO TID for 7 days Flagyl 2 grams PO to take after Amox Tussionex cough I will call if influenza swab is positive  Note written for patient to be out of work.   Iona Hansen Rasch, NP 11/07/2014 1:50 AM

## 2014-11-07 NOTE — Discharge Instructions (Signed)
Chlamydia Chlamydia is an infection. It is spread through sexual contact. Chlamydia can be in different areas of the body. These areas include the cervix, urethra, throat, or rectum. You may not know you have chlamydia because many people never develop the symptoms. Chlamydia is not difficult to treat once you know you have it. However, if it is left untreated, chlamydia can lead to more serious health problems.  CAUSES  Chlamydia is caused by bacteria. It is a sexually transmitted disease. It is passed from an infected partner during intimate contact. This contact could be with the genitals, mouth, or rectal area. Chlamydia can also be passed from mothers to babies during birth. SIGNS AND SYMPTOMS  There may not be any symptoms. This is often the case early in the infection. If symptoms develop, they may include:  Mild pain and discomfort when urinating.  Redness, soreness, and swelling (inflammation) of the rectum.  Vaginal discharge.  Painful intercourse.  Abdominal pain.  Bleeding between menstrual periods. DIAGNOSIS  To diagnose this infection, your health care provider will do a pelvic exam. Cultures will be taken of the vagina, cervix, urine, and possibly the rectum to verify the diagnosis.  TREATMENT You will be given antibiotic medicines. If you are pregnant, certain types of antibiotics will need to be avoided. Any sexual partners should also be treated, even if they do not show symptoms.  HOME CARE INSTRUCTIONS   Take your antibiotic medicine as directed by your health care provider. Finish the antibiotic even if you start to feel better.  Take medicines only as directed by your health care provider.  Inform any sexual partners about the infection. They should also be treated.  Do not have sexual contact until your health care provider tells you it is okay.  Get plenty of rest.  Eat a well-balanced diet.  Drink enough fluids to keep your urine clear or pale  yellow.  Keep all follow-up visits as directed by your health care provider. SEEK MEDICAL CARE IF:  You have painful urination.  You have abdominal pain.  You have vaginal discharge.  You have painful sexual intercourse.  You have bleeding between periods and after sex.  You have a fever. SEEK IMMEDIATE MEDICAL CARE IF:   You experience nausea or vomiting.  You experience excessive sweating (diaphoresis).  You have difficulty swallowing. MAKE SURE YOU:   Understand these instructions.  Will watch your condition.  Will get help right away if you are not doing well or get worse. Document Released: 05/31/2005 Document Revised: 01/05/2014 Document Reviewed: 04/28/2013 Exit[employer] Patient Information 2015 [employer]. This information is not intended to replace advice given to you by your health care provider. Make sure you discuss any questions you have with your health care provider.  

## 2014-11-07 NOTE — MAU Note (Signed)
E-sig not working. Pt given written and verbal d/c instructions but unable to sign

## 2014-11-11 ENCOUNTER — Ambulatory Visit (INDEPENDENT_AMBULATORY_CARE_PROVIDER_SITE_OTHER): Payer: Medicaid Other

## 2014-11-11 ENCOUNTER — Encounter: Payer: Self-pay | Admitting: Women's Health

## 2014-11-11 ENCOUNTER — Ambulatory Visit (INDEPENDENT_AMBULATORY_CARE_PROVIDER_SITE_OTHER): Payer: Medicaid Other | Admitting: Women's Health

## 2014-11-11 VITALS — BP 102/60 | HR 64 | Wt 123.0 lb

## 2014-11-11 DIAGNOSIS — Z363 Encounter for antenatal screening for malformations: Secondary | ICD-10-CM

## 2014-11-11 DIAGNOSIS — Z3402 Encounter for supervision of normal first pregnancy, second trimester: Secondary | ICD-10-CM

## 2014-11-11 DIAGNOSIS — A749 Chlamydial infection, unspecified: Secondary | ICD-10-CM

## 2014-11-11 DIAGNOSIS — Z331 Pregnant state, incidental: Secondary | ICD-10-CM

## 2014-11-11 DIAGNOSIS — Z1389 Encounter for screening for other disorder: Secondary | ICD-10-CM

## 2014-11-11 DIAGNOSIS — O98812 Other maternal infectious and parasitic diseases complicating pregnancy, second trimester: Secondary | ICD-10-CM

## 2014-11-11 DIAGNOSIS — Z36 Encounter for antenatal screening of mother: Secondary | ICD-10-CM | POA: Diagnosis not present

## 2014-11-11 DIAGNOSIS — A599 Trichomoniasis, unspecified: Secondary | ICD-10-CM

## 2014-11-11 LAB — POCT URINALYSIS DIPSTICK
Blood, UA: NEGATIVE
Glucose, UA: NEGATIVE
Ketones, UA: NEGATIVE
Nitrite, UA: NEGATIVE

## 2014-11-11 MED ORDER — AZITHROMYCIN 500 MG PO TABS: 1000.0000 mg | ORAL_TABLET | Freq: Once | ORAL | Status: DC

## 2014-11-11 MED ORDER — DOXYLAMINE-PYRIDOXINE 10-10 MG PO TBEC: DELAYED_RELEASE_TABLET | ORAL | Status: DC

## 2014-11-11 NOTE — Progress Notes (Signed)
U/S(19+0wks)-single IUP with +FCA noted,FHR-138 bpm, meas c/w dates, fluid WNL, posterior Gr 0 placenta, cx appears closed(3+cm), bilateral adnexa appears WNL, no major abnl noted, female fetus

## 2014-11-11 NOTE — Patient Instructions (Signed)
Second Trimester of Pregnancy The second trimester is from week 13 through week 28, months 4 through 6. The second trimester is often a time when you feel your best. Your body has also adjusted to being pregnant, and you begin to feel better physically. Usually, morning sickness has lessened or quit completely, you may have more energy, and you may have an increase in appetite. The second trimester is also a time when the fetus is growing rapidly. At the end of the sixth month, the fetus is about 9 inches long and weighs about 1 pounds. You will likely begin to feel the baby move (quickening) between 18 and 20 weeks of the pregnancy. BODY CHANGES Your body goes through many changes during pregnancy. The changes vary from woman to woman.   Your weight will continue to increase. You will notice your lower abdomen bulging out.  You may begin to get stretch marks on your hips, abdomen, and breasts.  You may develop headaches that can be relieved by medicines approved by your health care provider.  You may urinate more often because the fetus is pressing on your bladder.  You may develop or continue to have heartburn as a result of your pregnancy.  You may develop constipation because certain hormones are causing the muscles that push waste through your intestines to slow down.  You may develop hemorrhoids or swollen, bulging veins (varicose veins).  You may have back pain because of the weight gain and pregnancy hormones relaxing your joints between the bones in your pelvis and as a result of a shift in weight and the muscles that support your balance.  Your breasts will continue to grow and be tender.  Your gums may bleed and may be sensitive to brushing and flossing.  Dark spots or blotches (chloasma, mask of pregnancy) may develop on your face. This will likely fade after the baby is born.  A dark line from your belly button to the pubic area (linea nigra) may appear. This will likely fade  after the baby is born.  You may have changes in your hair. These can include thickening of your hair, rapid growth, and changes in texture. Some women also have hair loss during or after pregnancy, or hair that feels dry or thin. Your hair will most likely return to normal after your baby is born. WHAT TO EXPECT AT YOUR PRENATAL VISITS During a routine prenatal visit:  You will be weighed to make sure you and the fetus are growing normally.  Your blood pressure will be taken.  Your abdomen will be measured to track your baby's growth.  The fetal heartbeat will be listened to.  Any test results from the previous visit will be discussed. Your health care provider may ask you:  How you are feeling.  If you are feeling the baby move.  If you have had any abnormal symptoms, such as leaking fluid, bleeding, severe headaches, or abdominal cramping.  If you have any questions. Other tests that may be performed during your second trimester include:  Blood tests that check for:  Low iron levels (anemia).  Gestational diabetes (between 24 and 28 weeks).  Rh antibodies.  Urine tests to check for infections, diabetes, or protein in the urine.  An ultrasound to confirm the proper growth and development of the baby.  An amniocentesis to check for possible genetic problems.  Fetal screens for spina bifida and Down syndrome. HOME CARE INSTRUCTIONS   Avoid all smoking, herbs, alcohol, and unprescribed   drugs. These chemicals affect the formation and growth of the baby.  Follow your health care provider's instructions regarding medicine use. There are medicines that are either safe or unsafe to take during pregnancy.  Exercise only as directed by your health care provider. Experiencing uterine cramps is a good sign to stop exercising.  Continue to eat regular, healthy meals.  Wear a good support bra for breast tenderness.  Do not use hot tubs, steam rooms, or saunas.  Wear your  seat belt at all times when driving.  Avoid raw meat, uncooked cheese, cat litter boxes, and soil used by cats. These carry germs that can cause birth defects in the baby.  Take your prenatal vitamins.  Try taking a stool softener (if your health care provider approves) if you develop constipation. Eat more high-fiber foods, such as fresh vegetables or fruit and whole grains. Drink plenty of fluids to keep your urine clear or pale yellow.  Take warm sitz baths to soothe any pain or discomfort caused by hemorrhoids. Use hemorrhoid cream if your health care provider approves.  If you develop varicose veins, wear support hose. Elevate your feet for 15 minutes, 3-4 times a day. Limit salt in your diet.  Avoid heavy lifting, wear low heel shoes, and practice good posture.  Rest with your legs elevated if you have leg cramps or low back pain.  Visit your dentist if you have not gone yet during your pregnancy. Use a soft toothbrush to brush your teeth and be gentle when you floss.  A sexual relationship may be continued unless your health care provider directs you otherwise.  Continue to go to all your prenatal visits as directed by your health care provider. SEEK MEDICAL CARE IF:   You have dizziness.  You have mild pelvic cramps, pelvic pressure, or nagging pain in the abdominal area.  You have persistent nausea, vomiting, or diarrhea.  You have a bad smelling vaginal discharge.  You have pain with urination. SEEK IMMEDIATE MEDICAL CARE IF:   You have a fever.  You are leaking fluid from your vagina.  You have spotting or bleeding from your vagina.  You have severe abdominal cramping or pain.  You have rapid weight gain or loss.  You have shortness of breath with chest pain.  You notice sudden or extreme swelling of your face, hands, ankles, feet, or legs.  You have not felt your baby move in over an hour.  You have severe headaches that do not go away with  medicine.  You have vision changes. Document Released: 08/15/2001 Document Revised: 08/26/2013 Document Reviewed: 10/22/2012 Exit[employer] Patient Information 2015 [employer]. This information is not intended to replace advice given to you by your health care provider. Make sure you discuss any questions you have with your health care provider.  

## 2014-11-11 NOTE — Progress Notes (Signed)
Low-risk OB appointment G1P0 3280w0d Estimated Date of Delivery: 04/07/15 BP 102/60 mmHg  Pulse 64  Wt 123 lb (55.792 kg)  LMP 06/22/2014 (Approximate)  BP, weight, and urine reviewed.  Refer to obstetrical flow sheet for FH & FHR.  Reports good fm.  Denies regular uc's, lof, vb, or uti s/s. No complaints. Just dx w/ flu, taking tamiflu. Feeling much better already. Was treated for CT at [facility]U, threw up zithromax <8815mins later. Also dx w/ trich and given rx for flagyl to begin after finished w/ other meds. Partner treated for both. Rx zithromax 1gm x 1, can take today. Begin flagyl for trich when finished w/ amoxicillin. No sex until at least 7d after taking both zithromax and flagyl. Will do poc for both at next visit.  Reviewed ptl s/s, today's normal anatomy u/s. Plan:  Continue routine obstetrical care  F/U in 4wks for OB appointment

## 2014-12-09 ENCOUNTER — Other Ambulatory Visit (HOSPITAL_COMMUNITY)
Admission: RE | Admit: 2014-12-09 | Discharge: 2014-12-09 | Disposition: A | Discharge status: Home / Self Care | Payer: PRIVATE HEALTH INSURANCE | Source: Ambulatory Visit | Attending: Obstetrics & Gynecology | Admitting: Obstetrics & Gynecology

## 2014-12-09 ENCOUNTER — Encounter: Payer: Self-pay | Admitting: Women's Health

## 2014-12-09 ENCOUNTER — Ambulatory Visit (INDEPENDENT_AMBULATORY_CARE_PROVIDER_SITE_OTHER): Payer: Medicaid Other | Admitting: Women's Health

## 2014-12-09 VITALS — BP 98/62 | HR 72 | Wt 133.0 lb

## 2014-12-09 DIAGNOSIS — Z113 Encounter for screening for infections with a predominantly sexual mode of transmission: Secondary | ICD-10-CM | POA: Insufficient documentation

## 2014-12-09 DIAGNOSIS — O98819 Other maternal infectious and parasitic diseases complicating pregnancy, unspecified trimester: Secondary | ICD-10-CM

## 2014-12-09 DIAGNOSIS — Z331 Pregnant state, incidental: Secondary | ICD-10-CM

## 2014-12-09 DIAGNOSIS — O98319 Other infections with a predominantly sexual mode of transmission complicating pregnancy, unspecified trimester: Secondary | ICD-10-CM | POA: Diagnosis not present

## 2014-12-09 DIAGNOSIS — Z01419 Encounter for gynecological examination (general) (routine) without abnormal findings: Secondary | ICD-10-CM | POA: Insufficient documentation

## 2014-12-09 DIAGNOSIS — Z1389 Encounter for screening for other disorder: Secondary | ICD-10-CM

## 2014-12-09 DIAGNOSIS — A599 Trichomoniasis, unspecified: Secondary | ICD-10-CM

## 2014-12-09 DIAGNOSIS — Z3402 Encounter for supervision of normal first pregnancy, second trimester: Secondary | ICD-10-CM

## 2014-12-09 DIAGNOSIS — A749 Chlamydial infection, unspecified: Secondary | ICD-10-CM

## 2014-12-09 DIAGNOSIS — Z124 Encounter for screening for malignant neoplasm of cervix: Secondary | ICD-10-CM

## 2014-12-09 LAB — POCT URINALYSIS DIPSTICK
Blood, UA: NEGATIVE
Glucose, UA: NEGATIVE
Ketones, UA: NEGATIVE
Leukocytes, UA: NEGATIVE
Nitrite, UA: NEGATIVE
Protein, UA: NEGATIVE

## 2014-12-09 LAB — POCT WET PREP (WET MOUNT): Clue Cells Wet Prep Whiff POC: NEGATIVE

## 2014-12-09 NOTE — Progress Notes (Signed)
Low-risk OB appointment G1P0 244w0d Estimated Date of Delivery: 04/07/15 BP 98/62 mmHg  Pulse 72  Wt 133 lb (60.328 kg)  LMP 06/22/2014 (Approximate)  BP, weight, and urine reviewed.  Refer to obstetrical flow sheet for FH & FHR.  Reports good fm.  Denies regular uc's, lof, vb, or uti s/s. No complaints. Thin prep pap obtained (turned 19 in feb), trich poc neg per [facility] [place], ct poc from pap Reviewed ptl s/s, fm. Plan:  Continue routine obstetrical care  F/U in 4wks for OB appointment and pn2

## 2014-12-09 NOTE — Patient Instructions (Addendum)
You will have your sugar test next visit.  Please do not eat or drink anything after midnight the night before you come, not even water.  You will be here for at least two hours.     For your lower back pain you may:  Purchase a pregnancy belt from [employer], [employer], [employer], etc and wear it while you are up and about  Take warm baths  Use a heating pad to your lower back for no longer than 20 minutes at a time, and do not place near abdomen  Take tylenol as needed. Please follow directions on the bottle   Call the office [phone]) or go to [facility] if:  You begin to have strong, frequent contractions  Your water breaks.  Sometimes it is a big gush of fluid, sometimes it is just a trickle that keeps getting your panties wet or running down your legs  You have vaginal bleeding.  It is normal to have a small amount of spotting if your cervix was checked.   You don't feel your baby moving like normal.  If you don't, get you something to eat and drink and lay down and focus on feeling your baby move.   If your baby is still not moving like normal, you should call the office or go to [facility].  Second Trimester of Pregnancy The second trimester is from week 13 through week 28, months 4 through 6. The second trimester is often a time when you feel your best. Your body has also adjusted to being pregnant, and you begin to feel better physically. Usually, morning sickness has lessened or quit completely, you may have more energy, and you may have an increase in appetite. The second trimester is also a time when the fetus is growing rapidly. At the end of the sixth month, the fetus is about 9 inches long and weighs about 1 pounds. You will likely begin to feel the baby move (quickening) between 18 and 20 weeks of the pregnancy. BODY CHANGES Your body goes through many changes during pregnancy. The changes vary from woman to woman.   Your weight will continue to  increase. You will notice your lower abdomen bulging out.  You may begin to get stretch marks on your hips, abdomen, and breasts.  You may develop headaches that can be relieved by medicines approved by your health care provider.  You may urinate more often because the fetus is pressing on your bladder.  You may develop or continue to have heartburn as a result of your pregnancy.  You may develop constipation because certain hormones are causing the muscles that push waste through your intestines to slow down.  You may develop hemorrhoids or swollen, bulging veins (varicose veins).  You may have back pain because of the weight gain and pregnancy hormones relaxing your joints between the bones in your pelvis and as a result of a shift in weight and the muscles that support your balance.  Your breasts will continue to grow and be tender.  Your gums may bleed and may be sensitive to brushing and flossing.  Dark spots or blotches (chloasma, mask of pregnancy) may develop on your face. This will likely fade after the baby is born.  A dark line from your belly button to the pubic area (linea nigra) may appear. This will likely fade after the baby is born.  You may have changes in your hair. These can include thickening of your hair, rapid   growth, and changes in texture. Some women also have hair loss during or after pregnancy, or hair that feels dry or thin. Your hair will most likely return to normal after your baby is born. WHAT TO EXPECT AT YOUR PRENATAL VISITS During a routine prenatal visit:  You will be weighed to make sure you and the fetus are growing normally.  Your blood pressure will be taken.  Your abdomen will be measured to track your baby's growth.  The fetal heartbeat will be listened to.  Any test results from the previous visit will be discussed. Your health care provider may ask you:  How you are feeling.  If you are feeling the baby move.  If you have had any  abnormal symptoms, such as leaking fluid, bleeding, severe headaches, or abdominal cramping.  If you have any questions. Other tests that may be performed during your second trimester include:  Blood tests that check for:  Low iron levels (anemia).  Gestational diabetes (between 24 and 28 weeks).  Rh antibodies.  Urine tests to check for infections, diabetes, or protein in the urine.  An ultrasound to confirm the proper growth and development of the baby.  An amniocentesis to check for possible genetic problems.  Fetal screens for spina bifida and Down syndrome. HOME CARE INSTRUCTIONS  5. Avoid all smoking, herbs, alcohol, and unprescribed drugs. These chemicals affect the formation and growth of the baby. 6. Follow your health care provider's instructions regarding medicine use. There are medicines that are either safe or unsafe to take during pregnancy. 7. Exercise only as directed by your health care provider. Experiencing uterine cramps is a good sign to stop exercising. 8. Continue to eat regular, healthy meals. 9. Wear a good support bra for breast tenderness. 10. Do not use hot tubs, steam rooms, or saunas. 11. Wear your seat belt at all times when driving. 12. Avoid raw meat, uncooked cheese, cat litter boxes, and soil used by cats. These carry germs that can cause birth defects in the baby. 13. Take your prenatal vitamins. 14. Try taking a stool softener (if your health care provider approves) if you develop constipation. Eat more high-fiber foods, such as fresh vegetables or fruit and whole grains. Drink plenty of fluids to keep your urine clear or pale yellow. 15. Take warm sitz baths to soothe any pain or discomfort caused by hemorrhoids. Use hemorrhoid cream if your health care provider approves. 16. If you develop varicose veins, wear support hose. Elevate your feet for 15 minutes, 3-4 times a day. Limit salt in your diet. 17. Avoid heavy lifting, wear low heel shoes,  and practice good posture. 18. Rest with your legs elevated if you have leg cramps or low back pain. 19. Visit your dentist if you have not gone yet during your pregnancy. Use a soft toothbrush to brush your teeth and be gentle when you floss. 20. A sexual relationship may be continued unless your health care provider directs you otherwise. 21. Continue to go to all your prenatal visits as directed by your health care provider. SEEK MEDICAL CARE IF:   You have dizziness.  You have mild pelvic cramps, pelvic pressure, or nagging pain in the abdominal area.  You have persistent nausea, vomiting, or diarrhea.  You have a bad smelling vaginal discharge.  You have pain with urination. SEEK IMMEDIATE MEDICAL CARE IF:   You have a fever.  You are leaking fluid from your vagina.  You have spotting or bleeding from your   vagina.  You have severe abdominal cramping or pain.  You have rapid weight gain or loss.  You have shortness of breath with chest pain.  You notice sudden or extreme swelling of your face, hands, ankles, feet, or legs.  You have not felt your baby move in over an hour.  You have severe headaches that do not go away with medicine.  You have vision changes. Document Released: 08/15/2001 Document Revised: 08/26/2013 Document Reviewed: 10/22/2012 [employer] Patient Information 2015 [employer]. This information is not intended to replace advice given to you by your health care provider. Make sure you discuss any questions you have with your health care provider.     

## 2014-12-10 LAB — CYTOLOGY - PAP

## 2015-01-06 ENCOUNTER — Ambulatory Visit (INDEPENDENT_AMBULATORY_CARE_PROVIDER_SITE_OTHER): Payer: Medicaid Other | Admitting: Women's Health

## 2015-01-06 ENCOUNTER — Encounter: Payer: Self-pay | Admitting: Women's Health

## 2015-01-06 ENCOUNTER — Other Ambulatory Visit: Payer: Medicaid Other

## 2015-01-06 VITALS — BP 98/66 | HR 80 | Wt 134.0 lb

## 2015-01-06 DIAGNOSIS — Z331 Pregnant state, incidental: Secondary | ICD-10-CM

## 2015-01-06 DIAGNOSIS — Z1389 Encounter for screening for other disorder: Secondary | ICD-10-CM

## 2015-01-06 DIAGNOSIS — Z369 Encounter for antenatal screening, unspecified: Secondary | ICD-10-CM

## 2015-01-06 DIAGNOSIS — Z3402 Encounter for supervision of normal first pregnancy, second trimester: Secondary | ICD-10-CM

## 2015-01-06 DIAGNOSIS — Z131 Encounter for screening for diabetes mellitus: Secondary | ICD-10-CM

## 2015-01-06 DIAGNOSIS — F1290 Cannabis use, unspecified, uncomplicated: Secondary | ICD-10-CM

## 2015-01-06 DIAGNOSIS — O26842 Uterine size-date discrepancy, second trimester: Secondary | ICD-10-CM

## 2015-01-06 LAB — POCT URINALYSIS DIPSTICK
Blood, UA: NEGATIVE
GLUCOSE UA: NEGATIVE
KETONES UA: NEGATIVE
Nitrite, UA: NEGATIVE
Protein, UA: NEGATIVE

## 2015-01-06 LAB — OB RESULTS CONSOLE HIV ANTIBODY (ROUTINE TESTING): HIV: NONREACTIVE

## 2015-01-06 NOTE — Patient Instructions (Addendum)
Call the office [phone]) or go to [facility] if:  You begin to have strong, frequent contractions  Your water breaks.  Sometimes it is a big gush of fluid, sometimes it is just a trickle that keeps getting your panties wet or running down your legs  You have vaginal bleeding.  It is normal to have a small amount of spotting if your cervix was checked.   You don't feel your baby moving like normal.  If you don't, get you something to eat and drink and lay down and focus on feeling your baby move.  You should feel at least 10 movements in 2 hours.  If you don't, you should call the office or [facility].    Tdap Vaccine  It is recommended that you get the Tdap vaccine during the third trimester of EACH pregnancy to help protect your baby from getting pertussis (whooping cough)  27-36 weeks is the BEST time to do this so that you can pass the protection on to your baby. During pregnancy is better than after pregnancy, but if you are unable to get it during pregnancy it will be offered at the hospital.   You can get this vaccine at the health department or your family doctor  Everyone who will be around your baby should also be up-to-date on their vaccines. Adults (who are not pregnant) only need 1 dose of Tdap during adulthood.    For Headaches:   Stay well hydrated, drink enough water so that your urine is clear, sometimes if you are dehydrated you can get headaches  Eat small frequent meals and snacks, sometimes if you are hungry you can get headaches  Sometimes you get headaches during pregnancy from the pregnancy hormones  You can try tylenol (1-2 regular strength 325mg  or 1-2 extra strength 500mg ) as directed on the box. The least amount of medication that works is best.   Cool compresses (cool wet washcloth or ice pack) to area of head that is hurting  You can also try drinking a caffeinated drink to see if this will help  Call us if these things aren't  helping your headaches   [place] Pediatricians/Family Doctors:  [facility] [phone]            [facility] [phone]                 [facility] [phone] (usually not accepting new patients unless you have family there already, you are always welcome to call and ask)            [facility] ([address] [place]) [phone]   [facility]:   [facility]: [phone]  [facility]: [phone]   Third Trimester of Pregnancy The third trimester is from week 29 through week 42, months 7 through 9. The third trimester is a time when the fetus is growing rapidly. At the end of the ninth month, the fetus is about 20 inches in length and weighs 6-10 pounds.  BODY CHANGES Your body goes through many changes during pregnancy. The changes vary from woman to woman.  9. Your weight will continue to increase. You can expect to gain 25-35 pounds (11-16 kg) by the end of the pregnancy. 10. You may begin to get stretch marks on your hips, abdomen, and breasts. 11. You may urinate more often because the fetus is moving lower into your pelvis and pressing on your bladder. 12. You may develop  or continue to have heartburn as a result of your pregnancy. 13. You may develop constipation because certain hormones are causing the muscles that push waste through your intestines to slow down. 14. You may develop hemorrhoids or swollen, bulging veins (varicose veins). 15. You may have pelvic pain because of the weight gain and pregnancy hormones relaxing your joints between the bones in your pelvis. Backaches may result from overexertion of the muscles supporting your posture. 16. You may have changes in your hair. These can include thickening of your hair, rapid growth, and changes in texture. Some women also have hair loss during or after pregnancy, or hair that feels dry or thin. Your  hair will most likely return to normal after your baby is born. 17. Your breasts will continue to grow and be tender. A yellow discharge may leak from your breasts called colostrum. 18. Your belly button may stick out. 19. You may feel short of breath because of your expanding uterus. 20. You may notice the fetus "dropping," or moving lower in your abdomen. 21. You may have a bloody mucus discharge. This usually occurs a few days to a week before labor begins. 22. Your cervix becomes thin and soft (effaced) near your due date. WHAT TO EXPECT AT YOUR PRENATAL EXAMS  You will have prenatal exams every 2 weeks until week 36. Then, you will have weekly prenatal exams. During a routine prenatal visit: 10. You will be weighed to make sure you and the fetus are growing normally. 11. Your blood pressure is taken. 12. Your abdomen will be measured to track your baby's growth. 13. The fetal heartbeat will be listened to. 14. Any test results from the previous visit will be discussed. 15. You may have a cervical check near your due date to see if you have effaced. At around 36 weeks, your caregiver will check your cervix. At the same time, your caregiver will also perform a test on the secretions of the vaginal tissue. This test is to determine if a type of bacteria, Group B streptococcus, is present. Your caregiver will explain this further. Your caregiver may ask you: 2. What your birth plan is. 3. How you are feeling. 4. If you are feeling the baby move. 5. If you have had any abnormal symptoms, such as leaking fluid, bleeding, severe headaches, or abdominal cramping. 6. If you have any questions. Other tests or screenings that may be performed during your third trimester include: 2. Blood tests that check for low iron levels (anemia). 3. Fetal testing to check the health, activity level, and growth of the fetus. Testing is done if you have certain medical conditions or if there are problems during  the pregnancy. FALSE LABOR You may feel small, irregular contractions that eventually go away. These are called Braxton Hicks contractions, or false labor. Contractions may last for hours, days, or even weeks before true labor sets in. If contractions come at regular intervals, intensify, or become painful, it is best to be seen by your caregiver.  SIGNS OF LABOR  3. Menstrual-like cramps. 4. Contractions that are 5 minutes apart or less. 5. Contractions that start on the top of the uterus and spread down to the lower abdomen and back. 6. A sense of increased pelvic pressure or back pain. 7. A watery or bloody mucus discharge that comes from the vagina. If you have any of these signs before the 37th week of pregnancy, call your caregiver right away. You need to go to the  hospital to get checked immediately. HOME CARE INSTRUCTIONS   Avoid all smoking, herbs, alcohol, and unprescribed drugs. These chemicals affect the formation and growth of the baby.  Follow your caregiver's instructions regarding medicine use. There are medicines that are either safe or unsafe to take during pregnancy.  Exercise only as directed by your caregiver. Experiencing uterine cramps is a good sign to stop exercising.  Continue to eat regular, healthy meals.  Wear a good support bra for breast tenderness.  Do not use hot tubs, steam rooms, or saunas.  Wear your seat belt at all times when driving.  Avoid raw meat, uncooked cheese, cat litter boxes, and soil used by cats. These carry germs that can cause birth defects in the baby.  Take your prenatal vitamins.  Try taking a stool softener (if your caregiver approves) if you develop constipation. Eat more high-fiber foods, such as fresh vegetables or fruit and whole grains. Drink plenty of fluids to keep your urine clear or pale yellow.  Take warm sitz baths to soothe any pain or discomfort caused by hemorrhoids. Use hemorrhoid cream if your caregiver  approves.  If you develop varicose veins, wear support hose. Elevate your feet for 15 minutes, 3-4 times a day. Limit salt in your diet.  Avoid heavy lifting, wear low heal shoes, and practice good posture.  Rest a lot with your legs elevated if you have leg cramps or low back pain.  Visit your dentist if you have not gone during your pregnancy. Use a soft toothbrush to brush your teeth and be gentle when you floss.  A sexual relationship may be continued unless your caregiver directs you otherwise.  Do not travel far distances unless it is absolutely necessary and only with the approval of your caregiver.  Take prenatal classes to understand, practice, and ask questions about the labor and delivery.  Make a trial run to the hospital.  Pack your hospital bag.  Prepare the baby's nursery.  Continue to go to all your prenatal visits as directed by your caregiver. SEEK MEDICAL CARE IF:  You are unsure if you are in labor or if your water has broken.  You have dizziness.  You have mild pelvic cramps, pelvic pressure, or nagging pain in your abdominal area.  You have persistent nausea, vomiting, or diarrhea.  You have a bad smelling vaginal discharge.  You have pain with urination. SEEK IMMEDIATE MEDICAL CARE IF:   You have a fever.  You are leaking fluid from your vagina.  You have spotting or bleeding from your vagina.  You have severe abdominal cramping or pain.  You have rapid weight loss or gain.  You have shortness of breath with chest pain.  You notice sudden or extreme swelling of your face, hands, ankles, feet, or legs.  You have not felt your baby move in over an hour.  You have severe headaches that do not go away with medicine.  You have vision changes. Document Released: 08/15/2001 Document Revised: 08/26/2013 Document Reviewed: 10/22/2012 [facility] Patient Information 2015 [place], [place]. This information is not intended to replace advice given to  you by your health care provider. Make sure you discuss any questions you have with your health care provider.

## 2015-01-06 NOTE — Progress Notes (Signed)
Low-risk OB appointment G1P0 876w0d Estimated Date of Delivery: 04/07/15 BP 98/66 mmHg  Pulse 80  Wt 134 lb (60.782 kg)  LMP 06/22/2014 (Approximate)  BP, weight, and urine reviewed.  Refer to obstetrical flow sheet for FH & FHR.  Reports good fm.  Denies regular uc's, lof, vb, or uti s/s. HAs when waking up. Discussed prevention/relief measures Reviewed ptl s/s, fkc. Recommended Tdap at HD/PCP per CDC guidelines.  Plan:  Continue routine obstetrical care  F/U asap for efw/afi u/s (no visit), then 3wks for OB appointment

## 2015-01-07 LAB — PMP SCREEN PROFILE (10S), URINE
AMPHETAMINE SCRN UR: NEGATIVE ng/mL
Barbiturate Screen, Ur: NEGATIVE ng/mL
Benzodiazepine Screen, Urine: NEGATIVE ng/mL
COCAINE(METAB.) SCREEN, URINE: NEGATIVE ng/mL
Cannabinoids Ur Ql Scn: POSITIVE ng/mL
Creatinine(Crt), U: 152.6 mg/dL (ref 20.0–300.0)
METHADONE SCREEN, URINE: NEGATIVE ng/mL
OPIATE SCRN UR: NEGATIVE ng/mL
OXYCODONE+OXYMORPHONE UR QL SCN: NEGATIVE ng/mL
PCP Scrn, Ur: NEGATIVE ng/mL
PROPOXYPHENE SCREEN: NEGATIVE ng/mL
Ph of Urine: 7.8 (ref 4.5–8.9)

## 2015-01-07 LAB — CBC
HEMATOCRIT: 30.9 % — AB (ref 34.0–46.6)
Hemoglobin: 10.6 g/dL — ABNORMAL LOW (ref 11.1–15.9)
MCH: 28.1 pg (ref 26.6–33.0)
MCHC: 34.3 g/dL (ref 31.5–35.7)
MCV: 82 fL (ref 79–97)
PLATELETS: 283 10*3/uL (ref 150–379)
RBC: 3.77 x10E6/uL (ref 3.77–5.28)
RDW: 13.6 % (ref 12.3–15.4)
WBC: 6.3 10*3/uL (ref 3.4–10.8)

## 2015-01-07 LAB — HSV 2 ANTIBODY, IGG: HSV 2 Glycoprotein G Ab, IgG: <0.91 index (ref 0.00–0.90)

## 2015-01-07 LAB — GLUCOSE TOLERANCE, 2 HOURS W/ 1HR
Glucose, 1 hour: 123 mg/dL (ref 65–179)
Glucose, 2 hour: 69 mg/dL (ref 65–152)
Glucose, Fasting: 70 mg/dL (ref 65–91)

## 2015-01-07 LAB — RPR: RPR Ser Ql: NONREACTIVE

## 2015-01-07 LAB — HIV ANTIBODY (ROUTINE TESTING W REFLEX): HIV Screen 4th Generation wRfx: NONREACTIVE

## 2015-01-07 LAB — ANTIBODY SCREEN: ANTIBODY SCREEN: NEGATIVE

## 2015-01-14 ENCOUNTER — Ambulatory Visit (INDEPENDENT_AMBULATORY_CARE_PROVIDER_SITE_OTHER): Payer: Medicaid Other

## 2015-01-14 DIAGNOSIS — O26842 Uterine size-date discrepancy, second trimester: Secondary | ICD-10-CM | POA: Diagnosis not present

## 2015-01-14 DIAGNOSIS — A749 Chlamydial infection, unspecified: Secondary | ICD-10-CM

## 2015-01-14 DIAGNOSIS — K589 Irritable bowel syndrome without diarrhea: Secondary | ICD-10-CM

## 2015-01-14 DIAGNOSIS — Z3402 Encounter for supervision of normal first pregnancy, second trimester: Secondary | ICD-10-CM

## 2015-01-14 DIAGNOSIS — O98819 Other maternal infectious and parasitic diseases complicating pregnancy, unspecified trimester: Secondary | ICD-10-CM

## 2015-01-14 NOTE — Progress Notes (Signed)
US 1540w1d measurements c/w dates,EFW 1140g 47%,post pl gr 3 , normal ov's,breech, afi 13.5cm,cx 2.5cm,fht 140bpm

## 2015-01-27 ENCOUNTER — Ambulatory Visit (INDEPENDENT_AMBULATORY_CARE_PROVIDER_SITE_OTHER): Payer: Medicaid Other | Admitting: Advanced Practice Midwife

## 2015-01-27 ENCOUNTER — Encounter: Payer: Self-pay | Admitting: Advanced Practice Midwife

## 2015-01-27 VITALS — BP 120/80 | HR 80 | Wt 138.0 lb

## 2015-01-27 DIAGNOSIS — Z3402 Encounter for supervision of normal first pregnancy, second trimester: Secondary | ICD-10-CM

## 2015-01-27 DIAGNOSIS — Z1389 Encounter for screening for other disorder: Secondary | ICD-10-CM

## 2015-01-27 DIAGNOSIS — Z331 Pregnant state, incidental: Secondary | ICD-10-CM

## 2015-01-27 LAB — POCT URINALYSIS DIPSTICK
Blood, UA: NEGATIVE
Glucose, UA: NEGATIVE
Ketones, UA: NEGATIVE
Nitrite, UA: NEGATIVE

## 2015-01-27 NOTE — Progress Notes (Signed)
G1P0 3621w0d Estimated Date of Delivery: 04/07/15  Last menstrual period 06/22/2014.   BP weight and urine results all reviewed and noted.  Please refer to the obstetrical flow sheet for the fundal height and fetal heart rate documentation:  Patient reports good fetal movement, denies any bleeding and no rupture of membranes symptoms or regular contractions. Patient sometimes has pain in her left ear.  Ear looks fine, maybe allergies  All questions were answered.  Plan:  Continued routine obstetrical care, Start OTC Fe daily (hgb 10.6)  Follow up in 2 weeks for OB appointment,

## 2015-01-27 NOTE — Progress Notes (Signed)
Pt denies any problems or concerns at this time. Pt states that she still has some pain in her left ear at times.

## 2015-01-27 NOTE — Patient Instructions (Signed)
Iron-Rich Diet An iron-rich diet contains foods that are good sources of iron. Iron is an important mineral that helps your body produce hemoglobin. Hemoglobin is a protein in red blood cells that carries oxygen to the body's tissues. Sometimes, the iron level in your blood can be low. This may be caused by:  A lack of iron in your diet.  Blood loss.  Times of growth, such as during pregnancy or during a child's growth and development. Low levels of iron can cause a decrease in the number of red blood cells. This can result in iron deficiency anemia. Iron deficiency anemia symptoms include:  Tiredness.  Weakness.  Irritability.  Increased chance of infection. Here are some recommendations for daily iron intake:  Males older than 21 years of age need 8 mg of iron per day.  Women ages 19 to 50 need 18 mg of iron per day.  Pregnant women need 27 mg of iron per day, and women who are over 19 years of age and breastfeeding need 9 mg of iron per day.  Women over the age of 50 need 8 mg of iron per day. SOURCES OF IRON There are 2 types of iron that are found in food: heme iron and nonheme iron. Heme iron is absorbed by the body better than nonheme iron. Heme iron is found in meat, poultry, and fish. Nonheme iron is found in grains, beans, and vegetables. Heme Iron Sources Food / Iron (mg)  Chicken liver, 3 oz (85 g)/ 10 mg  Beef liver, 3 oz (85 g)/ 5.5 mg  Oysters, 3 oz (85 g)/ 8 mg  Beef, 3 oz (85 g)/ 2 to 3 mg  Shrimp, 3 oz (85 g)/ 2.8 mg  Turkey, 3 oz (85 g)/ 2 mg  Chicken, 3 oz (85 g) / 1 mg  Fish (tuna, halibut), 3 oz (85 g)/ 1 mg  Pork, 3 oz (85 g)/ 0.9 mg Nonheme Iron Sources Food / Iron (mg)  Ready-to-eat breakfast cereal, iron-fortified / 3.9 to 7 mg  Tofu,  cup / 3.4 mg  Kidney beans,  cup / 2.6 mg  Baked potato with skin / 2.7 mg  Asparagus,  cup / 2.2 mg  Avocado / 2 mg  Dried peaches,  cup / 1.6 mg  Raisins,  cup / 1.5 mg  Soy milk, 1 cup  / 1.5 mg  Whole-wheat bread, 1 slice / 1.2 mg  Spinach, 1 cup / 0.8 mg  Broccoli,  cup / 0.6 mg IRON ABSORPTION Certain foods can decrease the body's absorption of iron. Try to avoid these foods and beverages while eating meals with iron-containing foods:  Coffee.  Tea.  Fiber.  Soy. Foods containing vitamin C can help increase the amount of iron your body absorbs from iron sources, especially from nonheme sources. Eat foods with vitamin C along with iron-containing foods to increase your iron absorption. Foods that are high in vitamin C include many fruits and vegetables. Some good sources are:  Fresh orange juice.  Oranges.  Strawberries.  Mangoes.  Grapefruit.  Red bell peppers.  Green bell peppers.  Broccoli.  Potatoes with skin.  Tomato juice. Document Released: 04/04/2005 Document Revised: 11/13/2011 Document Reviewed: 02/09/2011 [employer] Patient Information 2015 [employer]. This information is not intended to replace advice given to you by your health care provider. Make sure you discuss any questions you have with your health care provider.  

## 2015-02-10 ENCOUNTER — Encounter: Payer: Medicaid Other | Admitting: Advanced Practice Midwife

## 2015-02-10 ENCOUNTER — Ambulatory Visit (INDEPENDENT_AMBULATORY_CARE_PROVIDER_SITE_OTHER): Payer: Medicaid Other | Admitting: Advanced Practice Midwife

## 2015-02-10 VITALS — BP 102/70 | HR 88 | Wt 139.0 lb

## 2015-02-10 DIAGNOSIS — Z331 Pregnant state, incidental: Secondary | ICD-10-CM | POA: Diagnosis not present

## 2015-02-10 DIAGNOSIS — Z3403 Encounter for supervision of normal first pregnancy, third trimester: Secondary | ICD-10-CM

## 2015-02-10 DIAGNOSIS — N898 Other specified noninflammatory disorders of vagina: Secondary | ICD-10-CM

## 2015-02-10 DIAGNOSIS — Z1389 Encounter for screening for other disorder: Secondary | ICD-10-CM

## 2015-02-10 LAB — POCT URINALYSIS DIPSTICK
GLUCOSE UA: NEGATIVE
KETONES UA: NEGATIVE
Nitrite, UA: NEGATIVE
RBC UA: NEGATIVE

## 2015-02-10 MED ORDER — FLUCONAZOLE 150 MG PO TABS: ORAL_TABLET | ORAL | Status: DC

## 2015-02-10 NOTE — Progress Notes (Signed)
G1P0 751w0d Estimated Date of Delivery: 04/07/15  Blood pressure 102/70, pulse 88, weight 139 lb (63.05 kg), last menstrual period 06/22/2014.   BP weight and urine results all reviewed and noted.  Please refer to the obstetrical flow sheet for the fundal height and fetal heart rate documentation:    Patient reports good fetal movement, denies any bleeding and no rupture of membranes symptoms or regular contractions. Patient c/o yellow pasty dc.  SSE: some yeast   Wet prep: small yeast. No trich, clue   All questions were answered.  Plan:  Continued routine obstetrical care, Rx diflucan 150mg  X3  Follow up in 2 weeks for OB appointment,

## 2015-02-24 ENCOUNTER — Ambulatory Visit (INDEPENDENT_AMBULATORY_CARE_PROVIDER_SITE_OTHER): Payer: Medicaid Other | Admitting: Obstetrics and Gynecology

## 2015-02-24 ENCOUNTER — Encounter: Payer: Self-pay | Admitting: Obstetrics and Gynecology

## 2015-02-24 VITALS — BP 100/66 | HR 76 | Wt 141.5 lb

## 2015-02-24 DIAGNOSIS — Z3493 Encounter for supervision of normal pregnancy, unspecified, third trimester: Secondary | ICD-10-CM

## 2015-02-24 DIAGNOSIS — Z1389 Encounter for screening for other disorder: Secondary | ICD-10-CM

## 2015-02-24 DIAGNOSIS — Z331 Pregnant state, incidental: Secondary | ICD-10-CM

## 2015-02-24 LAB — POCT URINALYSIS DIPSTICK
Glucose, UA: NEGATIVE
KETONES UA: NEGATIVE
Nitrite, UA: NEGATIVE
Protein, UA: NEGATIVE
RBC UA: NEGATIVE

## 2015-02-24 NOTE — Progress Notes (Signed)
Patient ID: Barbara Mclean, female   DOB: 1995/09/09, 19 y.o.   MRN: [id number]  G1P0 [redacted]w[redacted]d Estimated Date of Delivery: 04/07/15  Blood pressure 100/66, pulse 76, weight 141 lb 8 oz (64.184 kg), last menstrual period 06/22/2014.   refer to the ob flow sheet for FH and FHR, also BP, Wt, Urine results:notable for negative  Patient reports + good fetal movement, denies any bleeding and no rupture of membranes symptoms or regular contractions. Patient complaints: None Patient mentions she works at [employer] and has not been able to have time off. FOB's name is Marquita Palms and currently works a lot. Patient states she monitors her sodium intake.   FHR: 144 bpm FH: 30.5 cm   Questions were answered. Assessment: Pregnancy [redacted]w[redacted]d, low risk preg  encouraged [facility] childbirth classes. / tour Plan:  Continued routine obstetrical care  F/u in 2 weeks for pnx care, including GBS test@ 36+  This chart was SCRIBED for Christin Bach, MD by Ronney Lion, ED Scribe. This patient was seen in room 1 and the patient's care was started at 10:51 AM.  I personally performed the services described in this documentation, which was SCRIBED in my presence. The recorded information has been reviewed and considered accurate. It has been edited as necessary during review. Tilda Burrow, MD    +

## 2015-02-24 NOTE — Progress Notes (Signed)
Pt states that she has been having headaches recently.

## 2015-03-10 ENCOUNTER — Ambulatory Visit (INDEPENDENT_AMBULATORY_CARE_PROVIDER_SITE_OTHER): Payer: Medicaid Other | Admitting: Obstetrics and Gynecology

## 2015-03-10 VITALS — BP 106/72 | HR 84 | Wt 150.5 lb

## 2015-03-10 DIAGNOSIS — Z331 Pregnant state, incidental: Secondary | ICD-10-CM

## 2015-03-10 DIAGNOSIS — Z3493 Encounter for supervision of normal pregnancy, unspecified, third trimester: Secondary | ICD-10-CM

## 2015-03-10 DIAGNOSIS — Z3490 Encounter for supervision of normal pregnancy, unspecified, unspecified trimester: Secondary | ICD-10-CM | POA: Insufficient documentation

## 2015-03-10 DIAGNOSIS — Z1389 Encounter for screening for other disorder: Secondary | ICD-10-CM

## 2015-03-10 LAB — POCT URINALYSIS DIPSTICK
Blood, UA: NEGATIVE
Glucose, UA: NEGATIVE
KETONES UA: NEGATIVE
LEUKOCYTES UA: NEGATIVE
Nitrite, UA: NEGATIVE
PROTEIN UA: NEGATIVE

## 2015-03-10 NOTE — Progress Notes (Signed)
G1P0 7246w0d Estimated Date of Delivery: 04/07/15  Blood pressure 106/72, pulse 84, weight 150 lb 8 oz (68.266 kg), last menstrual period 06/22/2014.   refer to the ob flow sheet for FH and FHR, also BP, Wt, Urine results:notable for negative  Patient reports   good fetal movement, denies any bleeding and no rupture of membranes symptoms or regular contractions. Patient complaints:none, classes encouraged again.  Questions were answered. Assessment:  Plan:  Continued routine obstetrical care, q wk F/u in 1 weeks for gbs , exam

## 2015-03-17 ENCOUNTER — Ambulatory Visit (INDEPENDENT_AMBULATORY_CARE_PROVIDER_SITE_OTHER): Payer: Medicaid Other | Admitting: Advanced Practice Midwife

## 2015-03-17 VITALS — BP 118/78 | HR 72 | Wt 152.5 lb

## 2015-03-17 DIAGNOSIS — Z3493 Encounter for supervision of normal pregnancy, unspecified, third trimester: Secondary | ICD-10-CM

## 2015-03-17 DIAGNOSIS — Z1389 Encounter for screening for other disorder: Secondary | ICD-10-CM

## 2015-03-17 DIAGNOSIS — Z3685 Encounter for antenatal screening for Streptococcus B: Secondary | ICD-10-CM

## 2015-03-17 DIAGNOSIS — Z369 Encounter for antenatal screening, unspecified: Secondary | ICD-10-CM

## 2015-03-17 DIAGNOSIS — Z331 Pregnant state, incidental: Secondary | ICD-10-CM

## 2015-03-17 LAB — POCT URINALYSIS DIPSTICK
Blood, UA: NEGATIVE
Glucose, UA: NEGATIVE
Ketones, UA: NEGATIVE
Nitrite, UA: NEGATIVE
Protein, UA: NEGATIVE

## 2015-03-17 LAB — OB RESULTS CONSOLE GBS: GBS: POSITIVE

## 2015-03-17 LAB — OB RESULTS CONSOLE GC/CHLAMYDIA
CHLAMYDIA, DNA PROBE: NEGATIVE
Gonorrhea: NEGATIVE

## 2015-03-17 MED ORDER — OMEPRAZOLE 20 MG PO CPDR: 20.0000 mg | DELAYED_RELEASE_CAPSULE | Freq: Every day | ORAL | Status: DC

## 2015-03-17 NOTE — Patient Instructions (Signed)

## 2015-03-17 NOTE — Progress Notes (Signed)
G1P0 4862w0d Estimated Date of Delivery: 04/07/15  Blood pressure 118/78, pulse 72, weight 152 lb 8 oz (69.174 kg), last menstrual period 06/22/2014.   BP weight and urine results all reviewed and noted.  Please refer to the obstetrical flow sheet for the fundal height and fetal heart rate documentation:  Patient reports good fetal movement, denies any bleeding and no rupture of membranes symptoms or regular contractions. Patient is without complaints except increased DC, no irritation/itching/odor.  SSE:  Normal appearing dc All questions were answered.   Plan:  Continued routine obstetrical care, GBS collecxted  Follow up in 1 weeks for OB appointment,

## 2015-03-18 LAB — GC/CHLAMYDIA PROBE AMP
CHLAMYDIA, DNA PROBE: NEGATIVE
Neisseria gonorrhoeae by PCR: NEGATIVE

## 2015-03-20 LAB — CULTURE, BETA STREP (GROUP B ONLY): Strep Gp B Culture: POSITIVE — AB

## 2015-03-24 ENCOUNTER — Ambulatory Visit (INDEPENDENT_AMBULATORY_CARE_PROVIDER_SITE_OTHER): Payer: Medicaid Other | Admitting: Advanced Practice Midwife

## 2015-03-24 VITALS — BP 100/78 | HR 84 | Wt 153.5 lb

## 2015-03-24 DIAGNOSIS — Z3491 Encounter for supervision of normal pregnancy, unspecified, first trimester: Secondary | ICD-10-CM

## 2015-03-24 DIAGNOSIS — Z331 Pregnant state, incidental: Secondary | ICD-10-CM

## 2015-03-24 DIAGNOSIS — R319 Hematuria, unspecified: Secondary | ICD-10-CM

## 2015-03-24 DIAGNOSIS — Z1389 Encounter for screening for other disorder: Secondary | ICD-10-CM

## 2015-03-24 LAB — POCT URINALYSIS DIPSTICK
Blood, UA: 1
GLUCOSE UA: NEGATIVE
KETONES UA: NEGATIVE
Nitrite, UA: NEGATIVE
Protein, UA: NEGATIVE

## 2015-03-24 NOTE — Progress Notes (Signed)
G1P0 8446w0d Estimated Date of Delivery: 04/07/15  Blood pressure 100/78, pulse 84, weight 153 lb 8 oz (69.627 kg), last menstrual period 06/22/2014.   BP weight and urine results all reviewed and noted.  Please refer to the obstetrical flow sheet for the fundal height and fetal heart rate documentation:  Patient reports good fetal movement, denies any bleeding and no rupture of membranes symptoms or regular contractions. Patient is without complaints. All questions were answered.  Plan:  Continued routine obstetrical care,   Follow up in 1 weeks for OB appointment,

## 2015-03-24 NOTE — Addendum Note (Signed)
Addended by: Criss AlvinePULLIAM, CHRYSTAL G on: 03/24/2015 04:08 PM   Modules accepted: Orders

## 2015-03-25 LAB — URINE CULTURE: Organism ID, Bacteria: NO GROWTH

## 2015-03-31 ENCOUNTER — Ambulatory Visit (INDEPENDENT_AMBULATORY_CARE_PROVIDER_SITE_OTHER): Payer: Medicaid Other | Admitting: Advanced Practice Midwife

## 2015-03-31 VITALS — BP 110/80 | HR 84 | Wt 156.0 lb

## 2015-03-31 DIAGNOSIS — Z1389 Encounter for screening for other disorder: Secondary | ICD-10-CM

## 2015-03-31 DIAGNOSIS — Z331 Pregnant state, incidental: Secondary | ICD-10-CM

## 2015-03-31 DIAGNOSIS — Z3403 Encounter for supervision of normal first pregnancy, third trimester: Secondary | ICD-10-CM

## 2015-03-31 LAB — POCT URINALYSIS DIPSTICK
GLUCOSE UA: NEGATIVE
Ketones, UA: NEGATIVE
Nitrite, UA: NEGATIVE
Protein, UA: NEGATIVE
RBC UA: NEGATIVE

## 2015-03-31 NOTE — Progress Notes (Signed)
G1P0 [redacted]w[redacted]d Estimated Date of Delivery: 04/07/15  Blood pressure 110/80, pulse 84, weight 156 lb (70.761 kg), last menstrual period 06/22/2014.   BP weight and urine results all reviewed and noted.  Please refer to the obstetrical flow sheet for the fundal height and fetal heart rate documentation:  Patient reports good fetal movement, denies any bleeding and no rupture of membranes symptoms or regular contractions. Patient is without complaints. All questions were answered.  Plan:  Continued routine obstetrical care,   Follow up in 1 weeks for OB appointment,

## 2015-04-06 ENCOUNTER — Telehealth: Payer: Self-pay | Admitting: Advanced Practice Midwife

## 2015-04-07 ENCOUNTER — Encounter: Payer: Self-pay | Admitting: Advanced Practice Midwife

## 2015-04-07 ENCOUNTER — Ambulatory Visit (INDEPENDENT_AMBULATORY_CARE_PROVIDER_SITE_OTHER): Payer: Medicaid Other | Admitting: Advanced Practice Midwife

## 2015-04-07 VITALS — BP 110/80 | HR 84 | Wt 156.4 lb

## 2015-04-07 DIAGNOSIS — A599 Trichomoniasis, unspecified: Secondary | ICD-10-CM | POA: Diagnosis not present

## 2015-04-07 DIAGNOSIS — Z331 Pregnant state, incidental: Secondary | ICD-10-CM

## 2015-04-07 DIAGNOSIS — O23593 Infection of other part of genital tract in pregnancy, third trimester: Secondary | ICD-10-CM | POA: Diagnosis not present

## 2015-04-07 DIAGNOSIS — Z1389 Encounter for screening for other disorder: Secondary | ICD-10-CM

## 2015-04-07 DIAGNOSIS — Z3403 Encounter for supervision of normal first pregnancy, third trimester: Secondary | ICD-10-CM

## 2015-04-07 DIAGNOSIS — A5901 Trichomonal vulvovaginitis: Secondary | ICD-10-CM

## 2015-04-07 DIAGNOSIS — O480 Post-term pregnancy: Secondary | ICD-10-CM

## 2015-04-07 LAB — POCT URINALYSIS DIPSTICK
Blood, UA: NEGATIVE
Glucose, UA: NEGATIVE
Ketones, UA: NEGATIVE
Nitrite, UA: NEGATIVE

## 2015-04-07 MED ORDER — METRONIDAZOLE 500 MG PO TABS
2000.0000 mg | ORAL_TABLET | Freq: Once | ORAL | Status: DC
Start: 2015-04-07 — End: 2015-04-12

## 2015-04-07 NOTE — Addendum Note (Signed)
Addended by: Jacklyn Shell on: 04/07/2015 04:37 PM   Modules accepted: Orders

## 2015-04-07 NOTE — Patient Instructions (Signed)
If you are still pregnant on Friday, 8/12 at 11:30pm, come to [facility] Admissions Unit ([address] in [place], [place]) to start your induction!  Eat a light meal before you come.  Daisey Must!!

## 2015-04-07 NOTE — Telephone Encounter (Signed)
Pt seen on 04/07/2015 for vaginal itching with Cathie Beams, CNM.

## 2015-04-07 NOTE — Progress Notes (Addendum)
G1P0 [redacted]w[redacted]d Estimated Date of Delivery: 04/07/15  Weight 156 lb (70.761 kg), last menstrual period 06/22/2014.   BP weight and urine results all reviewed and noted.  Please refer to the obstetrical flow sheet for the fundal height and fetal heart rate documentation:  Patient reports good fetal movement, denies any bleeding and no rupture of membranes symptoms or regular contractions. Patient c/o increaed vasingal discharge.  Yellow frothy dc. Wet prep + trich All questions were answered.  Plan:  Continued routine obstetrical care, flagyl 2gm po, rx written for FOB.  No more sex  Follow up in 1 weeks for OB appointment, BPP. IOL scheduled for 8/12 at 2345 (Friday night).

## 2015-04-09 ENCOUNTER — Inpatient Hospital Stay (HOSPITAL_COMMUNITY)
Admission: EM | Admit: 2015-04-09 | Discharge: 2015-04-09 | Disposition: A | Discharge status: Home / Self Care | Payer: Medicaid Other | Source: Ambulatory Visit | Attending: Obstetrics & Gynecology | Admitting: Obstetrics & Gynecology

## 2015-04-09 ENCOUNTER — Telehealth: Payer: Self-pay | Admitting: *Deleted

## 2015-04-09 ENCOUNTER — Encounter (HOSPITAL_COMMUNITY): Payer: Self-pay | Admitting: *Deleted

## 2015-04-09 DIAGNOSIS — A5901 Trichomonal vulvovaginitis: Secondary | ICD-10-CM | POA: Diagnosis not present

## 2015-04-09 DIAGNOSIS — O98313 Other infections with a predominantly sexual mode of transmission complicating pregnancy, third trimester: Secondary | ICD-10-CM

## 2015-04-09 DIAGNOSIS — N898 Other specified noninflammatory disorders of vagina: Secondary | ICD-10-CM | POA: Diagnosis not present

## 2015-04-09 DIAGNOSIS — Z3A40 40 weeks gestation of pregnancy: Secondary | ICD-10-CM | POA: Diagnosis not present

## 2015-04-09 DIAGNOSIS — Z87891 Personal history of nicotine dependence: Secondary | ICD-10-CM | POA: Diagnosis not present

## 2015-04-09 DIAGNOSIS — O9989 Other specified diseases and conditions complicating pregnancy, childbirth and the puerperium: Secondary | ICD-10-CM | POA: Insufficient documentation

## 2015-04-09 HISTORY — DX: Irritable bowel syndrome, unspecified: K58.9

## 2015-04-09 LAB — WET PREP, GENITAL
Clue Cells Wet Prep HPF POC: NONE SEEN
Yeast Wet Prep HPF POC: NONE SEEN

## 2015-04-09 MED ORDER — ACETAMINOPHEN 325 MG PO TABS
650.0000 mg | ORAL_TABLET | Freq: Once | ORAL | Status: AC
  Administered 2015-04-09: 650 mg via ORAL
  Filled 2015-04-09: qty 2

## 2015-04-09 NOTE — MAU Note (Signed)
Pt states she & her partner were treated for trichomonas yesterday.

## 2015-04-09 NOTE — Telephone Encounter (Signed)
Pt states contractions though out the night, having clear watery discharge run down her leg. Pt advised to go to [facility] for evaluation. Pt verbalized understanding.

## 2015-04-09 NOTE — MAU Note (Signed)
Pt presents to MAU with complaints of clear leakage of fluid that started this morning around 9 with irregular contractions. Denies any vaginal bleeding

## 2015-04-09 NOTE — MAU Provider Note (Signed)
History     CSN: [id number]  Arrival date and time: 04/09/15 1150   First Provider Initiated Contact with Patient 04/09/15 1256      Chief Complaint  Patient presents with  . Rupture of Membranes  . Contractions   HPI Patient is 19 y.o. G1P0 [redacted]w[redacted]d here with complaints of leaking fluid. Started several days ago and has become more persistent. Reports being treated for trichamonas by outpatient provider on 04/07/15. Her partner was also treated.  Reports LOF/trickling fluid.  +FM,VB, contractions, vaginal discharge.  OB History    Gravida Para Term Preterm AB TAB SAB Ectopic Multiple Living   1               Past Medical History  Diagnosis Date  . URI (upper respiratory infection) 11/06/14  . Chlamydia   . IBS (irritable bowel syndrome)     Past Surgical History  Procedure Laterality Date  . No past surgeries      Family History  Problem Relation Age of Onset  . Anemia Mother   . Diabetes Maternal Grandmother   . Hypertension Maternal Grandmother   . Cancer Maternal Grandfather     bone    History  Substance Use Topics  . Smoking status: Former Smoker -- 0.50 packs/day    Types: Cigarettes  . Smokeless tobacco: Never Used  . Alcohol Use: No    Allergies: No Known Allergies  Prescriptions prior to admission  Medication Sig Dispense Refill Last Dose  . acetaminophen (TYLENOL) 325 MG tablet Take 650 mg by mouth every 6 (six) hours as needed for moderate pain or headache.    Past Week at Unknown time  . metroNIDAZOLE (FLAGYL) 500 MG tablet Take 4 tablets (2,000 mg total) by mouth once. 4 tablet 1 04/08/2015 at Unknown time  . Prenatal MV-Min-Fe Fum-FA-DHA (PRENATAL MULTIVITAMIN + DHA PO) Take 1 tablet by mouth daily.   04/08/2015 at Unknown time  . omeprazole (PRILOSEC) 20 MG capsule Take 1 capsule (20 mg total) by mouth daily. 30 capsule 1 04/06/2015    ROS Physical Exam   Blood pressure 132/73, pulse 85, temperature 97.7 F (36.5 C), resp. rate 18, height   (1.626 m), weight 157 lb (71.215 kg), last menstrual period 06/22/2014.  Physical Exam  Constitutional: She is oriented to person, place, and time. She appears well-developed and well-nourished. No distress.  Pregnant female  HENT:  Head: Normocephalic and atraumatic.  Eyes: Conjunctivae are normal. No scleral icterus.  Neck: Normal range of motion. Neck supple.  Cardiovascular: Normal rate and intact distal pulses.   Respiratory: Effort normal. She exhibits no tenderness.  GI: Soft. There is no tenderness. There is no rebound and no guarding.  Gravid  Genitourinary: Vagina normal.  Frothy discharge. No pooling.   Musculoskeletal: Normal range of motion. She exhibits no edema.  Neurological: She is alert and oriented to person, place, and time.  Skin: Skin is warm and dry. No rash noted.  Psychiatric: She has a normal mood and affect.    MAU Course  Procedures  MDM Negative ferning NST 135/mod/+accels/no decels Wet Prep- POS trich  Assessment and Plan  Barbara Mclean 19 y.o. Marland KitchenG1P0 at [redacted]w[redacted]d presenting with trickling fluid and concern for ROM but more likely resolving trich infection.   #trichamonas: dx in clinic and treated on 8/3. Her partner was treated as well. No need to repeat medications at this point. Will need TOC in 1 week. Discharge home with labor precuations. Has IOL scheduled  [name] [facility] 04/09/2015, 1:04 PM

## 2015-04-09 NOTE — Discharge Instructions (Signed)

## 2015-04-12 ENCOUNTER — Telehealth (HOSPITAL_COMMUNITY): Payer: Self-pay | Admitting: *Deleted

## 2015-04-12 ENCOUNTER — Inpatient Hospital Stay (HOSPITAL_COMMUNITY)
Admission: AD | Admit: 2015-04-12 | Discharge: 2015-04-14 | DRG: 775 | Disposition: A | Discharge status: Home / Self Care | Payer: Medicaid Other | Source: Ambulatory Visit | Attending: Family Medicine | Admitting: Family Medicine

## 2015-04-12 ENCOUNTER — Encounter (HOSPITAL_COMMUNITY): Payer: Self-pay | Admitting: *Deleted

## 2015-04-12 ENCOUNTER — Telehealth: Payer: Self-pay | Admitting: Advanced Practice Midwife

## 2015-04-12 ENCOUNTER — Inpatient Hospital Stay (HOSPITAL_COMMUNITY): Payer: Medicaid Other | Admitting: Anesthesiology

## 2015-04-12 DIAGNOSIS — O99824 Streptococcus B carrier state complicating childbirth: Secondary | ICD-10-CM | POA: Diagnosis present

## 2015-04-12 DIAGNOSIS — O99324 Drug use complicating childbirth: Secondary | ICD-10-CM | POA: Diagnosis present

## 2015-04-12 DIAGNOSIS — A749 Chlamydial infection, unspecified: Secondary | ICD-10-CM

## 2015-04-12 DIAGNOSIS — Z3403 Encounter for supervision of normal first pregnancy, third trimester: Secondary | ICD-10-CM

## 2015-04-12 DIAGNOSIS — Z3A40 40 weeks gestation of pregnancy: Secondary | ICD-10-CM | POA: Diagnosis present

## 2015-04-12 DIAGNOSIS — O9962 Diseases of the digestive system complicating childbirth: Secondary | ICD-10-CM | POA: Diagnosis present

## 2015-04-12 DIAGNOSIS — Z833 Family history of diabetes mellitus: Secondary | ICD-10-CM

## 2015-04-12 DIAGNOSIS — O98819 Other maternal infectious and parasitic diseases complicating pregnancy, unspecified trimester: Secondary | ICD-10-CM

## 2015-04-12 DIAGNOSIS — O4202 Full-term premature rupture of membranes, onset of labor within 24 hours of rupture: Secondary | ICD-10-CM

## 2015-04-12 DIAGNOSIS — F1290 Cannabis use, unspecified, uncomplicated: Secondary | ICD-10-CM | POA: Diagnosis present

## 2015-04-12 DIAGNOSIS — O4290 Premature rupture of membranes, unspecified as to length of time between rupture and onset of labor, unspecified weeks of gestation: Secondary | ICD-10-CM | POA: Diagnosis present

## 2015-04-12 DIAGNOSIS — Z3493 Encounter for supervision of normal pregnancy, unspecified, third trimester: Secondary | ICD-10-CM

## 2015-04-12 DIAGNOSIS — Z8249 Family history of ischemic heart disease and other diseases of the circulatory system: Secondary | ICD-10-CM

## 2015-04-12 DIAGNOSIS — O4292 Full-term premature rupture of membranes, unspecified as to length of time between rupture and onset of labor: Secondary | ICD-10-CM

## 2015-04-12 DIAGNOSIS — K589 Irritable bowel syndrome without diarrhea: Secondary | ICD-10-CM | POA: Diagnosis present

## 2015-04-12 DIAGNOSIS — Z87891 Personal history of nicotine dependence: Secondary | ICD-10-CM

## 2015-04-12 DIAGNOSIS — A599 Trichomoniasis, unspecified: Secondary | ICD-10-CM | POA: Diagnosis present

## 2015-04-12 LAB — CBC
HEMATOCRIT: 32.1 % — AB (ref 36.0–46.0)
Hemoglobin: 10.5 g/dL — ABNORMAL LOW (ref 12.0–15.0)
MCH: 26.4 pg (ref 26.0–34.0)
MCHC: 32.7 g/dL (ref 30.0–36.0)
MCV: 80.7 fL (ref 78.0–100.0)
PLATELETS: 271 10*3/uL (ref 150–400)
RBC: 3.98 MIL/uL (ref 3.87–5.11)
RDW: 15.5 % (ref 11.5–15.5)
WBC: 6.2 10*3/uL (ref 4.0–10.5)

## 2015-04-12 LAB — ABO/RH: ABO/RH(D): O POS

## 2015-04-12 LAB — TYPE AND SCREEN
ABO/RH(D): O POS
ANTIBODY SCREEN: NEGATIVE

## 2015-04-12 MED ORDER — OXYCODONE-ACETAMINOPHEN 5-325 MG PO TABS: 1.0000 | ORAL_TABLET | ORAL | Status: DC | PRN

## 2015-04-12 MED ORDER — FENTANYL 2.5 MCG/ML BUPIVACAINE 1/10 % EPIDURAL INFUSION (WH - ANES)
INTRAMUSCULAR | Status: DC | PRN
  Administered 2015-04-12: 14 mL/h via EPIDURAL

## 2015-04-12 MED ORDER — ACETAMINOPHEN 325 MG PO TABS: 650.0000 mg | ORAL_TABLET | ORAL | Status: DC | PRN

## 2015-04-12 MED ORDER — FENTANYL 2.5 MCG/ML BUPIVACAINE 1/10 % EPIDURAL INFUSION (WH - ANES)
14.0000 mL/h | INTRAMUSCULAR | Status: DC | PRN
  Administered 2015-04-12: 14 mL/h via EPIDURAL
  Filled 2015-04-12: qty 125

## 2015-04-12 MED ORDER — ZOLPIDEM TARTRATE 5 MG PO TABS: 5.0000 mg | ORAL_TABLET | Freq: Every evening | ORAL | Status: DC | PRN

## 2015-04-12 MED ORDER — DIPHENHYDRAMINE HCL 50 MG/ML IJ SOLN: 12.5000 mg | INTRAMUSCULAR | Status: DC | PRN

## 2015-04-12 MED ORDER — EPHEDRINE 5 MG/ML INJ: 10.0000 mg | INTRAVENOUS | Status: DC | PRN

## 2015-04-12 MED ORDER — PENICILLIN G POTASSIUM 5000000 UNITS IJ SOLR
2.5000 10*6.[IU] | INTRAVENOUS | Status: DC
  Administered 2015-04-12: 2.5 10*6.[IU] via INTRAVENOUS
  Filled 2015-04-12 (×4): qty 2.5

## 2015-04-12 MED ORDER — OXYTOCIN 40 UNITS IN LACTATED RINGERS INFUSION - SIMPLE MED
62.5000 mL/h | INTRAVENOUS | Status: DC
  Filled 2015-04-12: qty 1000

## 2015-04-12 MED ORDER — ONDANSETRON HCL 4 MG/2ML IJ SOLN: 4.0000 mg | Freq: Four times a day (QID) | INTRAMUSCULAR | Status: DC | PRN

## 2015-04-12 MED ORDER — LANOLIN HYDROUS EX OINT: TOPICAL_OINTMENT | CUTANEOUS | Status: DC | PRN

## 2015-04-12 MED ORDER — WITCH HAZEL-GLYCERIN EX PADS: 1.0000 "application " | MEDICATED_PAD | CUTANEOUS | Status: DC | PRN

## 2015-04-12 MED ORDER — ONDANSETRON HCL 4 MG/2ML IJ SOLN: 4.0000 mg | INTRAMUSCULAR | Status: DC | PRN

## 2015-04-12 MED ORDER — DIPHENHYDRAMINE HCL 25 MG PO CAPS: 25.0000 mg | ORAL_CAPSULE | Freq: Four times a day (QID) | ORAL | Status: DC | PRN

## 2015-04-12 MED ORDER — ONDANSETRON HCL 4 MG PO TABS: 4.0000 mg | ORAL_TABLET | ORAL | Status: DC | PRN

## 2015-04-12 MED ORDER — OXYCODONE-ACETAMINOPHEN 5-325 MG PO TABS: 2.0000 | ORAL_TABLET | ORAL | Status: DC | PRN

## 2015-04-12 MED ORDER — OXYTOCIN BOLUS FROM INFUSION
500.0000 mL | INTRAVENOUS | Status: DC
  Administered 2015-04-12: 500 mL via INTRAVENOUS

## 2015-04-12 MED ORDER — SIMETHICONE 80 MG PO CHEW: 80.0000 mg | CHEWABLE_TABLET | ORAL | Status: DC | PRN

## 2015-04-12 MED ORDER — PRENATAL MULTIVITAMIN CH
1.0000 | ORAL_TABLET | Freq: Every day | ORAL | Status: DC
  Administered 2015-04-13 – 2015-04-14 (×2): 1 via ORAL
  Filled 2015-04-12 (×2): qty 1

## 2015-04-12 MED ORDER — LACTATED RINGERS IV SOLN: 500.0000 mL | INTRAVENOUS | Status: DC | PRN

## 2015-04-12 MED ORDER — CITRIC ACID-SODIUM CITRATE 334-500 MG/5ML PO SOLN: 30.0000 mL | ORAL | Status: DC | PRN

## 2015-04-12 MED ORDER — LACTATED RINGERS IV SOLN
INTRAVENOUS | Status: DC
  Administered 2015-04-12 (×2): via INTRAVENOUS

## 2015-04-12 MED ORDER — LIDOCAINE HCL (PF) 1 % IJ SOLN
30.0000 mL | INTRAMUSCULAR | Status: AC | PRN
  Administered 2015-04-12: 30 mL via SUBCUTANEOUS
  Filled 2015-04-12: qty 30

## 2015-04-12 MED ORDER — PHENYLEPHRINE 40 MCG/ML (10ML) SYRINGE FOR IV PUSH (FOR BLOOD PRESSURE SUPPORT)
80.0000 ug | PREFILLED_SYRINGE | INTRAVENOUS | Status: DC | PRN
  Administered 2015-04-12 (×2): 80 ug via INTRAVENOUS
  Filled 2015-04-12: qty 20

## 2015-04-12 MED ORDER — TETANUS-DIPHTH-ACELL PERTUSSIS 5-2.5-18.5 LF-MCG/0.5 IM SUSP: 0.5000 mL | Freq: Once | INTRAMUSCULAR | Status: DC

## 2015-04-12 MED ORDER — FENTANYL 2.5 MCG/ML BUPIVACAINE 1/10 % EPIDURAL INFUSION (WH - ANES): 14.0000 mL/h | INTRAMUSCULAR | Status: DC | PRN

## 2015-04-12 MED ORDER — DIBUCAINE 1 % RE OINT: 1.0000 "application " | TOPICAL_OINTMENT | RECTAL | Status: DC | PRN

## 2015-04-12 MED ORDER — PENICILLIN G POTASSIUM 5000000 UNITS IJ SOLR
5.0000 10*6.[IU] | Freq: Once | INTRAVENOUS | Status: AC
  Administered 2015-04-12: 5 10*6.[IU] via INTRAVENOUS
  Filled 2015-04-12: qty 5

## 2015-04-12 MED ORDER — FENTANYL 2.5 MCG/ML BUPIVACAINE 1/10 % EPIDURAL INFUSION (WH - ANES): INTRAMUSCULAR | Status: DC | PRN

## 2015-04-12 MED ORDER — SENNOSIDES-DOCUSATE SODIUM 8.6-50 MG PO TABS
2.0000 | ORAL_TABLET | ORAL | Status: DC
  Administered 2015-04-12 – 2015-04-13 (×2): 2 via ORAL
  Filled 2015-04-12 (×2): qty 2

## 2015-04-12 MED ORDER — LIDOCAINE HCL (PF) 1 % IJ SOLN
INTRAMUSCULAR | Status: DC | PRN
  Administered 2015-04-12 (×2): 5 mL

## 2015-04-12 MED ORDER — BENZOCAINE-MENTHOL 20-0.5 % EX AERO
1.0000 "application " | INHALATION_SPRAY | CUTANEOUS | Status: DC | PRN
  Administered 2015-04-12: 1 via TOPICAL
  Filled 2015-04-12: qty 56

## 2015-04-12 MED ORDER — IBUPROFEN 600 MG PO TABS
600.0000 mg | ORAL_TABLET | Freq: Four times a day (QID) | ORAL | Status: DC
  Administered 2015-04-12 – 2015-04-14 (×7): 600 mg via ORAL
  Filled 2015-04-12 (×8): qty 1

## 2015-04-12 NOTE — Progress Notes (Signed)
Patient ID: Keyonna Comunale, female   DOB: 1996-05-25, 19 y.o.   MRN: [id number]  Labor Progress Note Called to room emergently for deceleration. When I walked into room there wear several RNs at beside and patient was in right lateral decubitus with oxygen in place.  O2 sat was placed on mother.  I examined the patient and she was found to be 6-7 cm. Infant responded to scalp stimulation. I placed FSE and IUPC to help more closely monitor labor.  FHR tracing improved while I was in the room.   Total time: 10 minutes with peiords rebound to the 120s during this period.  Lowest fetal HR was was 60. Variability was moderate throughout this time.   Dilation: 6.5 Effacement (%): 100 Cervical Position: Middle Station: -1, 0 Presentation: Vertex Exam by:: Dr. Alvester Morin  Assessment: Fetal bradycardia 2/2 to rapid dilation and descent.  Plan: monitor labor with FSE and IUPC  Federico Flake, MD 1:34 PM

## 2015-04-12 NOTE — Anesthesia Preprocedure Evaluation (Signed)
Anesthesia Evaluation  Patient identified by MRN, date of birth, ID band Patient awake and Patient confused    Reviewed: Allergy & Precautions, H&P , NPO status , Patient's Chart, lab work & pertinent test results  Airway Mallampati: II       Dental   Pulmonary former smoker,  breath sounds clear to auscultation  Pulmonary exam normal       Cardiovascular Exercise Tolerance: Good Normal cardiovascular examRhythm:regular Rate:Normal     Neuro/Psych    GI/Hepatic   Endo/Other    Renal/GU      Musculoskeletal   Abdominal   Peds  Hematology   Anesthesia Other Findings   Reproductive/Obstetrics (+) Pregnancy                             Anesthesia Physical Anesthesia Plan  ASA: II  Anesthesia Plan: Epidural   Post-op Pain Management:    Induction:   Airway Management Planned:   Additional Equipment:   Intra-op Plan:   Post-operative Plan:   Informed Consent: I have reviewed the patients History and Physical, chart, labs and discussed the procedure including the risks, benefits and alternatives for the proposed anesthesia with the patient or authorized representative who has indicated his/her understanding and acceptance.     Plan Discussed with:   Anesthesia Plan Comments:         Anesthesia Quick Evaluation  

## 2015-04-12 NOTE — Telephone Encounter (Signed)
Pt c/o contractions 5 minutes, gush of fluids early this am. Pt advised to go to [facility] for evaluation. Pt verbalized understanding.

## 2015-04-12 NOTE — MAU Note (Signed)
Per Marmarth, RN charge, pt to go to room 163.

## 2015-04-12 NOTE — MAU Note (Signed)
Was seen on Friday thought was leaking fluid was diagnosed with trich, had bloody show Saturday, Sunday, contractions more consistent.

## 2015-04-12 NOTE — H&P (Signed)
OBSTETRIC ADMISSION HISTORY AND PHYSICAL  Barbara Mclean is a 19 y.o. female G1P0 with IUP at [redacted]w[redacted]d by 12wk presenting for ROM and contractions. She reports +FMs, No LOF, no VB, no blurry vision, headaches or peripheral edema, and RUQ pain.  She plans on breast feeding. She request undecided for birth control.  Contractions started at 0734 AM and they have been every 5-6 minutes for at lest 2 hours and they became more intense and increased frequency.   [facility]  FOB Barbara Mclean  Dating By 12wk u/s  Pap 12/09/14: neg  GC/CT Initial:    -/+  POC:  -/-          36+wks:  Genetic Screen NT/IT: neg  CF screen neg  Anatomic [place] Normal female  Flu vaccine Dx w/ Flu on 3/5  Tdap received  Glucose Screen  2 hr 16,109,60  GBS POS  Feed Preference breast  Contraception Undecided, discussed  Circumcision n/a  Childbirth Classes Recommended, info given  Pediatrician Undecided, info given   Dating: By 12wk--->  Estimated Date of Delivery: 04/07/15  Sono:   [place] [redacted]w[redacted]d measurements c/w dates,EFW 1140g 47%,post pl gr 3 , normal ov's,breech, afi 13.5cm,cx 2.5cm,fht 140bpm   Prenatal History/Complications:  Past Medical History: Past Medical History  Diagnosis Date  . URI (upper respiratory infection) 11/06/14  . Chlamydia   . IBS (irritable bowel syndrome)     Past Surgical History: Past Surgical History  Procedure Laterality Date  . No past surgeries      Obstetrical History: OB History    Gravida Para Term Preterm AB TAB SAB Ectopic Multiple Living   1               Social History: History   Social History  . Marital Status: Single    Spouse Name: N/A  . Number of Children: N/A  . Years of Education: N/A   Social History Main Topics  . Smoking status: Former Smoker -- 0.50 packs/day    Types: Cigarettes  . Smokeless tobacco: Never Used  . Alcohol Use: No  . Drug Use: No     Comment: marijuana use prior to pregnancy  . Sexual Activity: Not Currently    Other Topics Concern  . Not on file   Social History Narrative    Family History: Family History  Problem Relation Age of Onset  . Anemia Mother   . Diabetes Maternal Grandmother   . Hypertension Maternal Grandmother   . Cancer Maternal Grandfather     bone    Allergies: No Known Allergies  Prescriptions prior to admission  Medication Sig Dispense Refill Last Dose  . acetaminophen (TYLENOL) 325 MG tablet Take 650 mg by mouth every 6 (six) hours as needed for moderate pain or headache.    Past Week at Unknown time  . metroNIDAZOLE (FLAGYL) 500 MG tablet Take 4 tablets (2,000 mg total) by mouth once. 4 tablet 1 04/08/2015 at Unknown time  . omeprazole (PRILOSEC) 20 MG capsule Take 1 capsule (20 mg total) by mouth daily. 30 capsule 1 04/06/2015  . Prenatal MV-Min-Fe Fum-FA-DHA (PRENATAL MULTIVITAMIN + DHA PO) Take 1 tablet by mouth daily.   04/08/2015 at Unknown time     Review of Systems   All systems reviewed and negative except as stated in HPI  Blood pressure 122/79, pulse 95, temperature 97.7 F (36.5 C), temperature source Oral, resp. rate 16, last menstrual period 06/22/2014. General appearance: alert and cooperative Lungs: clear to auscultation bilaterally  Heart: regular rate and rhythm Abdomen: soft, non-tender; bowel sounds normal Pelvic: Adequate Extremities: Homans sign is negative, no sign of DVT DTR's 2+ Presentation: cephalic Fetal monitoringBaseline: 130 bpm/mod/+accels/ No decels Uterine activityDate/time of onset: 04/12/2015 at 0734, Frequency: Every 3-4 minutes, Duration: 45 seconds and Intensity: moderate   Prenatal labs: ABO, Rh: O/--/-- (02/03 1533) Antibody: Negative (05/04 0911) Rubella:   RPR: Non Reactive (05/04 0911)  HBsAg: Negative (02/03 1533)  HIV:   NEG GBS:   POS 1 hr Glucola wnl Genetic screening  Neg Anatomy US wnl  Prenatal Transfer Tool  Maternal Diabetes: No Genetic Screening: Normal Maternal Ultrasounds/Referrals:  Normal Fetal Ultrasounds or other Referrals:  None Maternal Substance Abuse:  Yes:  Type: Marijuana Significant Maternal Medications:  None Significant Maternal Lab Results: Lab values include: Group B Strep positive, Other: +Chlamydia in pregnancy with neg TOC in July. Trichamonas- treated 8/3  No results found for this or any previous visit (from the past 24 hour(s)).  Patient Active Problem List   Diagnosis Date Noted  . Supervision of low-risk pregnancy 03/10/2015  . 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 Trichomonas infection 11/11/2014  . IBS (irritable bowel syndrome) 10/28/2014  . Chlamydia infection during pregnancy, antepartum 10/13/2014  . Supervision of normal first pregnancy 10/07/2014    Assessment: Barbara Mclean is a 19 y.o. G1P0 at [redacted]w[redacted]d here for ROM with Ctx  #Labor: Expectant management.  #Pain: Desires epidural eventually but would like to walk and use IV medications #FWB: Cat 1, plan for intermittent monitoring to allow for movement/walking #ID: GBS pos, start PCN now given ROM.  #MOF: Breast #MOC: Undecided- planning pregnancy in 3+ years.  #Circ:  NA  [name] [facility] 04/12/2015, 11:24 AM

## 2015-04-12 NOTE — Anesthesia Procedure Notes (Signed)
Epidural Patient location during procedure: OB Start time: 04/12/2015 12:37 PM End time: 04/12/2015 12:47 PM  Staffing Anesthesiologist: Sebastian Ache  Preanesthetic Checklist Completed: patient identified, site marked, surgical consent, pre-op evaluation, timeout performed, IV checked, risks and benefits discussed and monitors and equipment checked  Epidural Patient position: sitting Prep: site prepped and draped and DuraPrep Patient monitoring: heart rate, continuous pulse ox and blood pressure Approach: midline Location: L3-L4 Injection technique: LOR air  Needle:  Needle type: Tuohy  Needle gauge: 17 G Needle length: 9 cm and 9 Catheter type: closed end flexible Catheter size: 19 Gauge Catheter at skin depth: 13 cm Test dose: negative  Assessment Events: blood not aspirated, injection not painful, no injection resistance, negative IV test and no paresthesia  Additional Notes   Patient tolerated the insertion well without complications.Reason for block:procedure for pain

## 2015-04-13 LAB — HIV ANTIBODY (ROUTINE TESTING W REFLEX): HIV Screen 4th Generation wRfx: NONREACTIVE

## 2015-04-13 LAB — RPR: RPR Ser Ql: NONREACTIVE

## 2015-04-13 NOTE — Clinical Social Work Maternal (Signed)
CLINICAL SOCIAL WORK MATERNAL/CHILD NOTE  Patient Details  Name: Barbara Mclean MRN: [id number] Date of Birth: 03/29/1996  Date:  05-08-15  Clinical Social Worker Initiating Note:  Loleta Books, LCSW Date/ Time Initiated:  04/13/15/1445     Child's Name:  Barbara Mclean   Legal Guardian: Barbara Mclean and Vladimir Crofts (parents)  Need for Interpreter:  None   Date of Referral:  Oct 19, 2014     Reason for Referral:  Current Substance Use/Substance Use During Pregnancy    Referral Source:  [facility]   Address:  [address] [place], [place] [address]  Phone number:  [phone]   Household Members:  Grandmother  Natural Supports (not living in the home):  FOB, Immediate Family, Parent, Extended Family   Professional Supports: None   Employment: Full-time   Type of Work: Employed at [employer]   Education:    N/A  Surveyor, quantity Resources:  Medicaid   Other Resources:    None identified  Cultural/Religious Considerations Which May Impact Care:  None reported  Strengths:  Ability to meet basic needs , Home prepared for child    Risk Factors/Current Problems:   1)Substance Use: MOB presents with THC use during the pregnancy. MOB was vague about last THC use. Infant's UDS and MDS are pending.    Cognitive State:  Able to Concentrate , Alert , Goal Oriented , Linear Thinking    Mood/Affect:  Animated, Bright , Happy    CSW Assessment:  CSW received request for consult due to MOB presenting with THC use during the pregnancy. MOB and FOB presented as easily engaged and receptive to the visit, with MOB providing consent for the FOB to remain in the room during the assessment. MOB displayed a full range in affect and presented in a pleasant mood.  MOB was observed to be feeding and bonding with the infant during the visit.   CSW assisted the MOB to reflect upon and process her thoughts and feelings as she transitions to the postpartum period. MOB discussed normative  range of emotions during the pregnancy, while in L&D, and now postpartum. She shared that she was primarily anxious and nervous during the pregnancy secondary to going into labor and giving birth. She discussed how her experiences was what she had anticipated, and indicated satisfaction with her birthing story. MOB shared that she feels "great" now that she is no longer pregnant and has to worry about giving birth.  MOB discussed painful latch while attempting to breastfeeding, and expressed hope that it will get better and easier.  She discussed having an extensive support system of family and friends that will continue to support her and the FOB as they continue to adjust to their new normal and their role transition.  MOB denied prior mental health history, and denied symptoms of perinatal mood and anxiety disorders during the pregnancy. MOB presented as attentive and engaged as CSW provided education, and agreed to contact her medical provider if she notes onset of symptoms.  MOB acknowledged THC use that is listed in her chart.  She stated that she previously used THC since she "wanted to get high".  MOB shared that she has not had any THC since she learned of the pregnancy.  MOB verbalized understanding of the hospital drug screen policy, and denied concerns related to collection of the infant's urine and meconium. MOB expressed confidence that both the UDS and MDS will be negative for the infant. MOB aware that CPS will be contacted if the infant has  a positive drug screen, and she continued to express belief that the drug screens will be negative.  MOB denied questions, concerns, or needs at this time. She expressed appreciation for the visit, information, and support, and agreed to contact CSW if needs arise during this admission.   CSW Plan/Description:   1)Patient/Family Education: Hospital drug screen policy 2) CSW to monitor infant's UDS and MDS and will make a CPS report if positive.   3)Information/Referral to [facility]: Perinatal mood and anxiety disorders 4)No Further Intervention Required/No Barriers to Discharge    Kelby Fam 05/17/15, 3:15 PM

## 2015-04-13 NOTE — Anesthesia Postprocedure Evaluation (Signed)
22 Anesthesia Post-op Note  Patient: Barbara Mclean  Procedure(s) Performed: * No procedures listed *  Patient Location: PACU and Mother/Baby  Anesthesia Type:Epidural  Level of Consciousness: awake, alert  and oriented  Airway and Oxygen Therapy: Patient Spontanous Breathing  Post-op Pain: mild  Post-op Assessment: Post-op Vital signs reviewed              Post-op Vital Signs: Reviewed and stable  Last Vitals:  Filed Vitals:   04/13/15 0633  BP: 124/79  Pulse: 80  Temp: 36.7 C  Resp: 18    Complications: No apparent anesthesia complications

## 2015-04-13 NOTE — Progress Notes (Signed)
UR chart review completed.  

## 2015-04-13 NOTE — Progress Notes (Signed)
Post Partum Day 1 Subjective:  Barbara Mclean is a 19 y.o. G1P1001 [redacted]w[redacted]d s/p NSVD.  No acute events overnight.  Pt denies problems with ambulating, voiding or po intake.  She denies nausea or vomiting.  Pain is well controlled.  She has had flatus. She has not had bowel movement.  Lochia Small.  Plan for birth control is Unsure .  Method of Feeding: Breast  Objective: Blood pressure 124/79, pulse 80, temperature 98.1 F (36.7 C), temperature source Oral, resp. rate 18, last menstrual period 06/22/2014, SpO2 100 %, unknown if currently breastfeeding.  Physical Exam:  General: alert, cooperative and no distress Lochia:normal flow Chest: CTAB Heart: RRR no m/r/g Abdomen: +BS, soft, nontender,  Uterine Fundus: firm, below umbilicus DVT Evaluation: No evidence of DVT seen on physical exam. Extremities: no edema   Recent Labs  04/12/15 1135  HGB 10.5*  HCT 32.1*    Assessment/Plan:  ASSESSMENT: Barbara Mclean is a 19 y.o. G1P1001 [redacted]w[redacted]d s/p NSVD  Plan for discharge tomorrow, Breastfeeding and Contraception needs dedicated discussion later today   LOS: 1 day   Federico Flake 04/13/2015, 6:39 AM

## 2015-04-13 NOTE — Lactation Note (Signed)
This note was copied from the chart of Barbara [facility]. Lactation Consultation Note  Patient Name: Barbara Mclean Today's Date: 04/13/2015 Reason for consult: Initial assessment Mom was started on nipple shield by RN for nipple tenderness. Mom reports she continues to have nipple pain. Baby appears to be latched well on the nipple shield, however wanted to try #16 but baby was in good suckling pattern and Mom did not want to take baby off the breast and try the different size. Mom reports she will try with next feeding. LC did observe colostrum in the nipple shield with this feeding and Mom reports observing colostrum with feedings. Care for sore nipples reviewed, advised to apply EBM, comfort gels given with instructions. Encouraged Mom to post pump with some feedings to encourage milk production and to have EBM to supplement if needed due to latching difficulties and using the nipple shield. Mom declined LC to set up DEBP, she has manual pump but reports she will not pump at this time. Basic teaching reviewed, cluster feeding discussed. Lactation brochure left for review, advised of OP services and support group. Encouraged to call for assist as desired.   Maternal Data Has patient been taught Hand Expression?: No (Mom declined demonstration, reports she knows how to hand express) Does the patient have breastfeeding experience prior to this delivery?: No  Feeding Feeding Type: Breast Fed Length of feed: 10 min  LATCH Score/Interventions Latch: Grasps breast easily, tongue down, lips flanged, rhythmical sucking. (using #20 nipple shield)  Audible Swallowing: A few with stimulation  Type of Nipple: Everted at rest and after stimulation (short shaft bilateral)  Comfort (Breast/Nipple): Filling, red/small blisters or bruises, mild/mod discomfort  Problem noted: Mild/Moderate discomfort Interventions (Mild/moderate discomfort): Comfort gels;Hand massage;Hand expression  Hold  (Positioning): Assistance needed to correctly position infant at breast and maintain latch. Intervention(s): Breastfeeding basics reviewed;Support Pillows;Position options;Skin to skin  LATCH Score: 7  Lactation Tools Discussed/Used Tools: Nipple Shields;Pump;Comfort gels Nipple shield size: 20 Breast pump type: Manual WIC Program: Yes   Consult Status Consult Status: Follow-up Date: 04/14/15 Follow-up type: In-patient    Alfred Levins 04/13/2015, 4:38 PM

## 2015-04-14 ENCOUNTER — Other Ambulatory Visit: Payer: Medicaid Other

## 2015-04-14 ENCOUNTER — Encounter: Payer: Medicaid Other | Admitting: Advanced Practice Midwife

## 2015-04-14 MED ORDER — IBUPROFEN 600 MG PO TABS: 600.0000 mg | ORAL_TABLET | Freq: Four times a day (QID) | ORAL | Status: DC

## 2015-04-14 NOTE — Discharge Instructions (Signed)

## 2015-04-14 NOTE — Discharge Summary (Signed)
Obstetric Discharge Summary Reason for Admission: onset of labor Prenatal Procedures: none Intrapartum Procedures: spontaneous vaginal delivery Postpartum Procedures: none Complications-Operative and Postpartum: none  Delivery Note After reaching complete dilatation and effacement, baby had variable decelerations. We were called to room to assess and patient was +2/+3 station and favorable to push. Baby had fetal bradycardia down to 90 with good variability throughout and intermittent return to baseline. Mother pushed 5 times prior to delivery and baby had nuchal x1, easily reduced at the perineum. Apgars were reassuring and baby was not in distress after delivery.   At 4:30 PM a viable and healthy female was delivered via Vaginal, Spontaneous Delivery (Presentation: ; Occiput Anterior). APGAR: 8, 9; weight pending .  Placenta status: Intact, Spontaneous. Cord: 3 vessels with the following complications: Loose nuchal x1, easily reduced. Cord pH: N/A  Anesthesia: Epidural  Episiotomy: None Lacerations: Sulcus;Labial Suture Repair: 3.0 vicryl Est. Blood Loss (mL): 161  Mom to postpartum. Baby to Couplet care / Skin to Skin.   Hospital Course:  Active Problems:   Supervision of normal first pregnancy   Chlamydia infection during pregnancy, antepartum   IBS (irritable bowel syndrome)   Trichomonas infection   Marijuana use   PROM (premature rupture of membranes)   Barbara Mclean is a 19 y.o. G1P1001 s/p SVD.  Patient was admitted 8/8.  She has postpartum course that was uncomplicated including no problems with ambulating, PO intake, urination, pain, or bleeding. The pt feels ready to go home and  will be discharged with outpatient follow-up.   Today: No acute events overnight.  Pt denies problems with ambulating, voiding or po intake.  She denies nausea or vomiting.  Pain is well controlled.  She has had flatus. She has had bowel movement.  Lochia Minimal.  Plan for birth  control is  Depo-Provera.  Method of Feeding: Breast and bottle  Physical Exam:  General: alert, cooperative, appears stated age and no distress Lochia: appropriate Uterine Fundus: firm DVT Evaluation: No evidence of DVT seen on physical exam.  H/H: Lab Results  Component Value Date/Time   HGB 10.5* 04/12/2015 11:35 AM   HCT 32.1* 04/12/2015 11:35 AM   HCT 30.9* 01/06/2015 09:11 AM    Discharge Diagnoses: Term Pregnancy-delivered  Discharge Information: Date: 04/14/2015 Activity: pelvic rest Diet: routine  Medications: PNV and Ibuprofen Breast feeding:  Yes Condition: stable Instructions: refer to handout Discharge to: home       Discharge Instructions    Discharge patient    Complete by:  As directed   Please have patient seen by lactation prior to discharge.            Medication List    TAKE these medications        acetaminophen 325 MG tablet  Commonly known as:  TYLENOL  Take 650 mg by mouth every 6 (six) hours as needed for moderate pain or headache.     ibuprofen 600 MG tablet  Commonly known as:  ADVIL,MOTRIN  Take 1 tablet (600 mg total) by mouth every 6 (six) hours.     omeprazole 20 MG capsule  Commonly known as:  PRILOSEC  Take 1 capsule (20 mg total) by mouth daily.     PRENATAL MULTIVITAMIN + DHA PO  Take 1 tablet by mouth daily.       Follow-up Information    Follow up with [facility]. Schedule an appointment as soon as possible for a visit in 6 weeks.   Why:  for  postpartum visit   Contact information:   [address] [place] [id number] [phone]      Lowanda Foster ,MD 04/14/2015,6:23 AM   OB FELLOW DISCHARGE ATTESTATION  I have seen and examined this patient and agree with above documentation in the resident's note.   Barbara Mclean is a 19 y.o. G1P1001 s/p NSVD.   Pain is well controlled.    PE:  BP 123/88 mmHg  Pulse 83  Temp(Src) 97.9 F (36.6 C) (Oral)  Resp 18  SpO2 100%  LMP  06/22/2014 (Approximate)  Breastfeeding? Unknown Fundus firm   Recent Labs  04/12/15 1135  HGB 10.5*  HCT 32.1*     Plan: discharge today - postpartum care discussed - f/u clinic in 6 weeks for postpartum visit   Silvano Bilis, MD 7:35 AM

## 2015-04-14 NOTE — Lactation Note (Addendum)
This note was copied from the chart of Barbara [facility]. Lactation Consultation Note  RN called to assist mother with formula syringe feeding. Mother's nipples have been sore.  Discussed pumping w/ DEBP. Suggested prefilling NS with formula but at this time mother does not want to breastfeed or pump. RN assisted mother with syringe feeding.   Patient Name: Barbara Mclean GLOVF'I Date: 04/14/2015 Reason for consult: Follow-up assessment   Maternal Data    Feeding Feeding Type: Breast Fed Length of feed: 15 min  LATCH Score/Interventions                      Lactation Tools Discussed/Used     Consult Status Consult Status: Follow-up Date: 04/15/15 Follow-up type: In-patient    [name] [facility] 04/14/2015, 12:20 AM

## 2015-04-16 ENCOUNTER — Inpatient Hospital Stay (HOSPITAL_COMMUNITY): Admission: RE | Admit: 2015-04-16 | Payer: PRIVATE HEALTH INSURANCE | Source: Ambulatory Visit

## 2015-05-26 ENCOUNTER — Ambulatory Visit (INDEPENDENT_AMBULATORY_CARE_PROVIDER_SITE_OTHER): Payer: Medicaid Other | Admitting: Women's Health

## 2015-05-26 ENCOUNTER — Encounter: Payer: Self-pay | Admitting: Women's Health

## 2015-05-26 DIAGNOSIS — F53 Postpartum depression: Secondary | ICD-10-CM

## 2015-05-26 DIAGNOSIS — O99345 Other mental disorders complicating the puerperium: Secondary | ICD-10-CM

## 2015-05-26 MED ORDER — MEDROXYPROGESTERONE ACETATE 150 MG/ML IM SUSP: 150.0000 mg | INTRAMUSCULAR | Status: DC

## 2015-05-26 NOTE — Patient Instructions (Signed)
Postpartum Depression and Baby Blues The postpartum period begins right after the birth of a baby. During this time, there is often a great amount of joy and excitement. It is also a time of many changes in the life of the parents. Regardless of how many times a mother gives birth, each child brings new challenges and dynamics to the family. It is not unusual to have feelings of excitement along with confusing shifts in moods, emotions, and thoughts. All mothers are at risk of developing postpartum depression or the "baby blues." These mood changes can occur right after giving birth, or they may occur many months after giving birth. The baby blues or postpartum depression can be mild or severe. Additionally, postpartum depression can go away rather quickly, or it can be a long-term condition.  CAUSES Raised hormone levels and the rapid drop in those levels are thought to be a main cause of postpartum depression and the baby blues. A number of hormones change during and after pregnancy. Estrogen and progesterone usually decrease right after the delivery of your baby. The levels of thyroid hormone and various cortisol steroids also rapidly drop. Other factors that play a role in these mood changes include major life events and genetics.  RISK FACTORS If you have any of the following risks for the baby blues or postpartum depression, know what symptoms to watch out for during the postpartum period. Risk factors that may increase the likelihood of getting the baby blues or postpartum depression include:  Having a personal or family history of depression.   Having depression while being pregnant.   Having premenstrual mood issues or mood issues related to oral contraceptives.  Having a lot of life stress.   Having marital conflict.   Lacking a social support network.   Having a baby with special needs.   Having health problems, such as diabetes.  SIGNS AND SYMPTOMS Symptoms of baby blues  include:  Brief changes in mood, such as going from extreme happiness to sadness.  Decreased concentration.   Difficulty sleeping.   Crying spells, tearfulness.   Irritability.   Anxiety.  Symptoms of postpartum depression typically begin within the first month after giving birth. These symptoms include:  Difficulty sleeping or excessive sleepiness.   Marked weight loss.   Agitation.   Feelings of worthlessness.   Lack of interest in activity or food.  Postpartum psychosis is a very serious condition and can be dangerous. Fortunately, it is rare. Displaying any of the following symptoms is cause for immediate medical attention. Symptoms of postpartum psychosis include:   Hallucinations and delusions.   Bizarre or disorganized behavior.   Confusion or disorientation.  DIAGNOSIS  A diagnosis is made by an evaluation of your symptoms. There are no medical or lab tests that lead to a diagnosis, but there are various questionnaires that a health care provider may use to identify those with the baby blues, postpartum depression, or psychosis. Often, a screening tool called the Edinburgh Postnatal Depression Scale is used to diagnose depression in the postpartum period.  TREATMENT The baby blues usually goes away on its own in 1-2 weeks. Social support is often all that is needed. You will be encouraged to get adequate sleep and rest. Occasionally, you may be given medicines to help you sleep.  Postpartum depression requires treatment because it can last several months or longer if it is not treated. Treatment may include individual or group therapy, medicine, or both to address any social, physiological, and psychological   factors that may play a role in the depression. Regular exercise, a healthy diet, rest, and social support may also be strongly recommended.  Postpartum psychosis is more serious and needs treatment right away. Hospitalization is often needed. HOME CARE  INSTRUCTIONS  Get as much rest as you can. Nap when the baby sleeps.   Exercise regularly. Some women find yoga and walking to be beneficial.   Eat a balanced and nourishing diet.   Do little things that you enjoy. Have a cup of tea, take a bubble bath, read your favorite magazine, or listen to your favorite music.  Avoid alcohol.   Ask for help with household chores, cooking, grocery shopping, or running errands as needed. Do not try to do everything.   Talk to people close to you about how you are feeling. Get support from your partner, family members, friends, or other new moms.  Try to stay positive in how you think. Think about the things you are grateful for.   Do not spend a lot of time alone.   Only take over-the-counter or prescription medicine as directed by your health care provider.  Keep all your postpartum appointments.   Let your health care provider know if you have any concerns.  SEEK MEDICAL CARE IF: You are having a reaction to or problems with your medicine. SEEK IMMEDIATE MEDICAL CARE IF:  You have suicidal feelings.   You think you may harm the baby or someone else. MAKE SURE YOU:  Understand these instructions.  Will watch your condition.  Will get help right away if you are not doing well or get worse. Document Released: 05/25/2004 Document Revised: 08/26/2013 Document Reviewed: 06/02/2013 [facility] Patient Information 2015 [place], [place]. This information is not intended to replace advice given to you by your health care provider. Make sure you discuss any questions you have with your health care provider.  Medroxyprogesterone injection [Contraceptive] What is this medicine? MEDROXYPROGESTERONE (me DROX ee proe JES te rone) contraceptive injections prevent pregnancy. They provide effective birth control for 3 months. Depo-subQ Provera 104 is also used for treating pain related to endometriosis. This medicine may be used for other  purposes; ask your health care provider or pharmacist if you have questions. COMMON BRAND NAME(S): Depo-Provera, Depo-subQ Provera 104 What should I tell my health care provider before I take this medicine? They need to know if you have any of these conditions: -frequently drink alcohol -asthma -blood vessel disease or a history of a blood clot in the lungs or legs -bone disease such as osteoporosis -breast cancer -diabetes -eating disorder (anorexia nervosa or bulimia) -high blood pressure -HIV infection or AIDS -kidney disease -liver disease -mental depression -migraine -seizures (convulsions) -stroke -tobacco smoker -vaginal bleeding -an unusual or allergic reaction to medroxyprogesterone, other hormones, medicines, foods, dyes, or preservatives -pregnant or trying to get pregnant -breast-feeding How should I use this medicine? Depo-Provera Contraceptive injection is given into a muscle. Depo-subQ Provera 104 injection is given under the skin. These injections are given by a health care professional. You must not be pregnant before getting an injection. The injection is usually given during the first 5 days after the start of a menstrual period or 6 weeks after delivery of a baby. Talk to your pediatrician regarding the use of this medicine in children. Special care may be needed. These injections have been used in female children who have started having menstrual periods. Overdosage: If you think you have taken too much of this medicine contact a  poison control center or emergency room at once. NOTE: This medicine is only for you. Do not share this medicine with others. What if I miss a dose? Try not to miss a dose. You must get an injection once every 3 months to maintain birth control. If you cannot keep an appointment, call and reschedule it. If you wait longer than 13 weeks between Depo-Provera contraceptive injections or longer than 14 weeks between Depo-subQ Provera 104  injections, you could get pregnant. Use another method for birth control if you miss your appointment. You may also need a pregnancy test before receiving another injection. What may interact with this medicine? Do not take this medicine with any of the following medications: -bosentan This medicine may also interact with the following medications: -aminoglutethimide -antibiotics or medicines for infections, especially rifampin, rifabutin, rifapentine, and griseofulvin -aprepitant -barbiturate medicines such as phenobarbital or primidone -bexarotene -carbamazepine -medicines for seizures like ethotoin, felbamate, oxcarbazepine, phenytoin, topiramate -modafinil -St. John's wort This list may not describe all possible interactions. Give your health care provider a list of all the medicines, herbs, non-prescription drugs, or dietary supplements you use. Also tell them if you smoke, drink alcohol, or use illegal drugs. Some items may interact with your medicine. What should I watch for while using this medicine? This drug does not protect you against HIV infection (AIDS) or other sexually transmitted diseases. Use of this product may cause you to lose calcium from your bones. Loss of calcium may cause weak bones (osteoporosis). Only use this product for more than 2 years if other forms of birth control are not right for you. The longer you use this product for birth control the more likely you will be at risk for weak bones. Ask your health care professional how you can keep strong bones. You may have a change in bleeding pattern or irregular periods. Many females stop having periods while taking this drug. If you have received your injections on time, your chance of being pregnant is very low. If you think you may be pregnant, see your health care professional as soon as possible. Tell your health care professional if you want to get pregnant within the next year. The effect of this medicine may  last a long time after you get your last injection. What side effects may I notice from receiving this medicine? Side effects that you should report to your doctor or health care professional as soon as possible: -allergic reactions like skin rash, itching or hives, swelling of the face, lips, or tongue -breast tenderness or discharge -breathing problems -changes in vision -depression -feeling faint or lightheaded, falls -fever -pain in the abdomen, chest, groin, or leg -problems with balance, talking, walking -unusually weak or tired -yellowing of the eyes or skin Side effects that usually do not require medical attention (report to your doctor or health care professional if they continue or are bothersome): -acne -fluid retention and swelling -headache -irregular periods, spotting, or absent periods -temporary pain, itching, or skin reaction at site where injected -weight gain This list may not describe all possible side effects. Call your doctor for medical advice about side effects. You may report side effects to FDA at 1-800-FDA-1088. Where should I keep my medicine? This does not apply. The injection will be given to you by a health care professional. NOTE: This sheet is a summary. It may not cover all possible information. If you have questions about this medicine, talk to your doctor, pharmacist, or health care provider.  2015, Elsevier/Gold Standard. (2008-09-11 18:37:56)

## 2015-05-26 NOTE — Progress Notes (Signed)
Patient ID: Barbara Mclean, female   DOB: 1995-09-20, 19 y.o.   MRN: [id number] Subjective:    Barbara Mclean is a 19 y.o. G34P1001 African American female who presents for a postpartum visit. She is 6 weeks postpartum following a spontaneous vaginal delivery at 40.5 gestational weeks. Anesthesia: epidural. I have fully reviewed the prenatal and intrapartum course. Postpartum course has been uncomplicated. Baby's course has been uncomplicated. Baby is feeding by breast x 1-2 days, now bottle. Bleeding no bleeding. Bowel function is normal. Bladder function is normal. Patient is not sexually active. Last sexual activity: prior to birth of baby. Contraception method is wants depo. Postpartum depression screening: positive. Score 12.  Having a lot of issues w/ fob's family. No appetite, sleeps some, wants to be by self. Denies SI/HI/II. Discussed options- does not want meds, would like to try counseling. Last pap 12-09-14 and was neg.  The following portions of the patient's history were reviewed and updated as appropriate: allergies, current medications, past medical history, past surgical history and problem list.  Review of Systems Pertinent items are noted in HPI.   Filed Vitals:   05/26/15 1153  BP: 114/64  Pulse: 72  Weight: 140 lb (63.504 kg)   No LMP recorded.  Objective:   General:  alert, cooperative and no distress   Breasts:  deferred, no complaints  Lungs: clear to auscultation bilaterally  Heart:  regular rate and rhythm  Abdomen: soft, nontender   Vulva: normal  Vagina: normal vagina  Cervix:  closed  Corpus: Well-involuted  Adnexa:  Non-palpable  Rectal Exam: No hemorrhoids        Assessment:   Postpartum exam 6 wks s/p SVB Bottlefeeding PPD Depression screening Contraception counseling   Plan:   Contraception: rx depo w/ 3RF- understands can potentially worsen depression  [facility] referral sent F/U today for depo injection Follow up in: 4wks for f/u,  or earlier if needed  Marge Duncans CNM, [facility] 05/26/2015 11:59 AM

## 2015-05-27 ENCOUNTER — Ambulatory Visit (INDEPENDENT_AMBULATORY_CARE_PROVIDER_SITE_OTHER): Payer: Medicaid Other | Admitting: *Deleted

## 2015-05-27 ENCOUNTER — Encounter: Payer: Self-pay | Admitting: *Deleted

## 2015-05-27 DIAGNOSIS — Z3042 Encounter for surveillance of injectable contraceptive: Secondary | ICD-10-CM

## 2015-05-27 DIAGNOSIS — Z3202 Encounter for pregnancy test, result negative: Secondary | ICD-10-CM

## 2015-05-27 DIAGNOSIS — Z3200 Encounter for pregnancy test, result unknown: Secondary | ICD-10-CM

## 2015-05-27 LAB — POCT URINE PREGNANCY: Preg Test, Ur: NEGATIVE

## 2015-05-27 MED ORDER — MEDROXYPROGESTERONE ACETATE 150 MG/ML IM SUSP
150.0000 mg | Freq: Once | INTRAMUSCULAR | Status: AC
  Administered 2015-05-27: 150 mg via INTRAMUSCULAR

## 2015-05-27 NOTE — Progress Notes (Signed)
Patient ID: Barbara Mclean, female   DOB: 1996-07-30, 19 y.o.   MRN: [id number] Pt here today for DEPO injection. Pt denies any problems or concerns at this time.

## 2015-06-23 ENCOUNTER — Ambulatory Visit: Payer: Medicaid Other | Admitting: Women's Health

## 2015-06-28 ENCOUNTER — Ambulatory Visit: Payer: Medicaid Other | Admitting: Women's Health

## 2015-08-19 ENCOUNTER — Ambulatory Visit: Payer: Medicaid Other

## 2015-10-11 IMAGING — CR DG CHEST 2V
2 series · 2 of 2 positions shown · non-contrast
Comparison: None.

CLINICAL DATA: Right upper flank pain for 1 week.

EXAM:
CHEST  2 VIEW

[w chest pa]
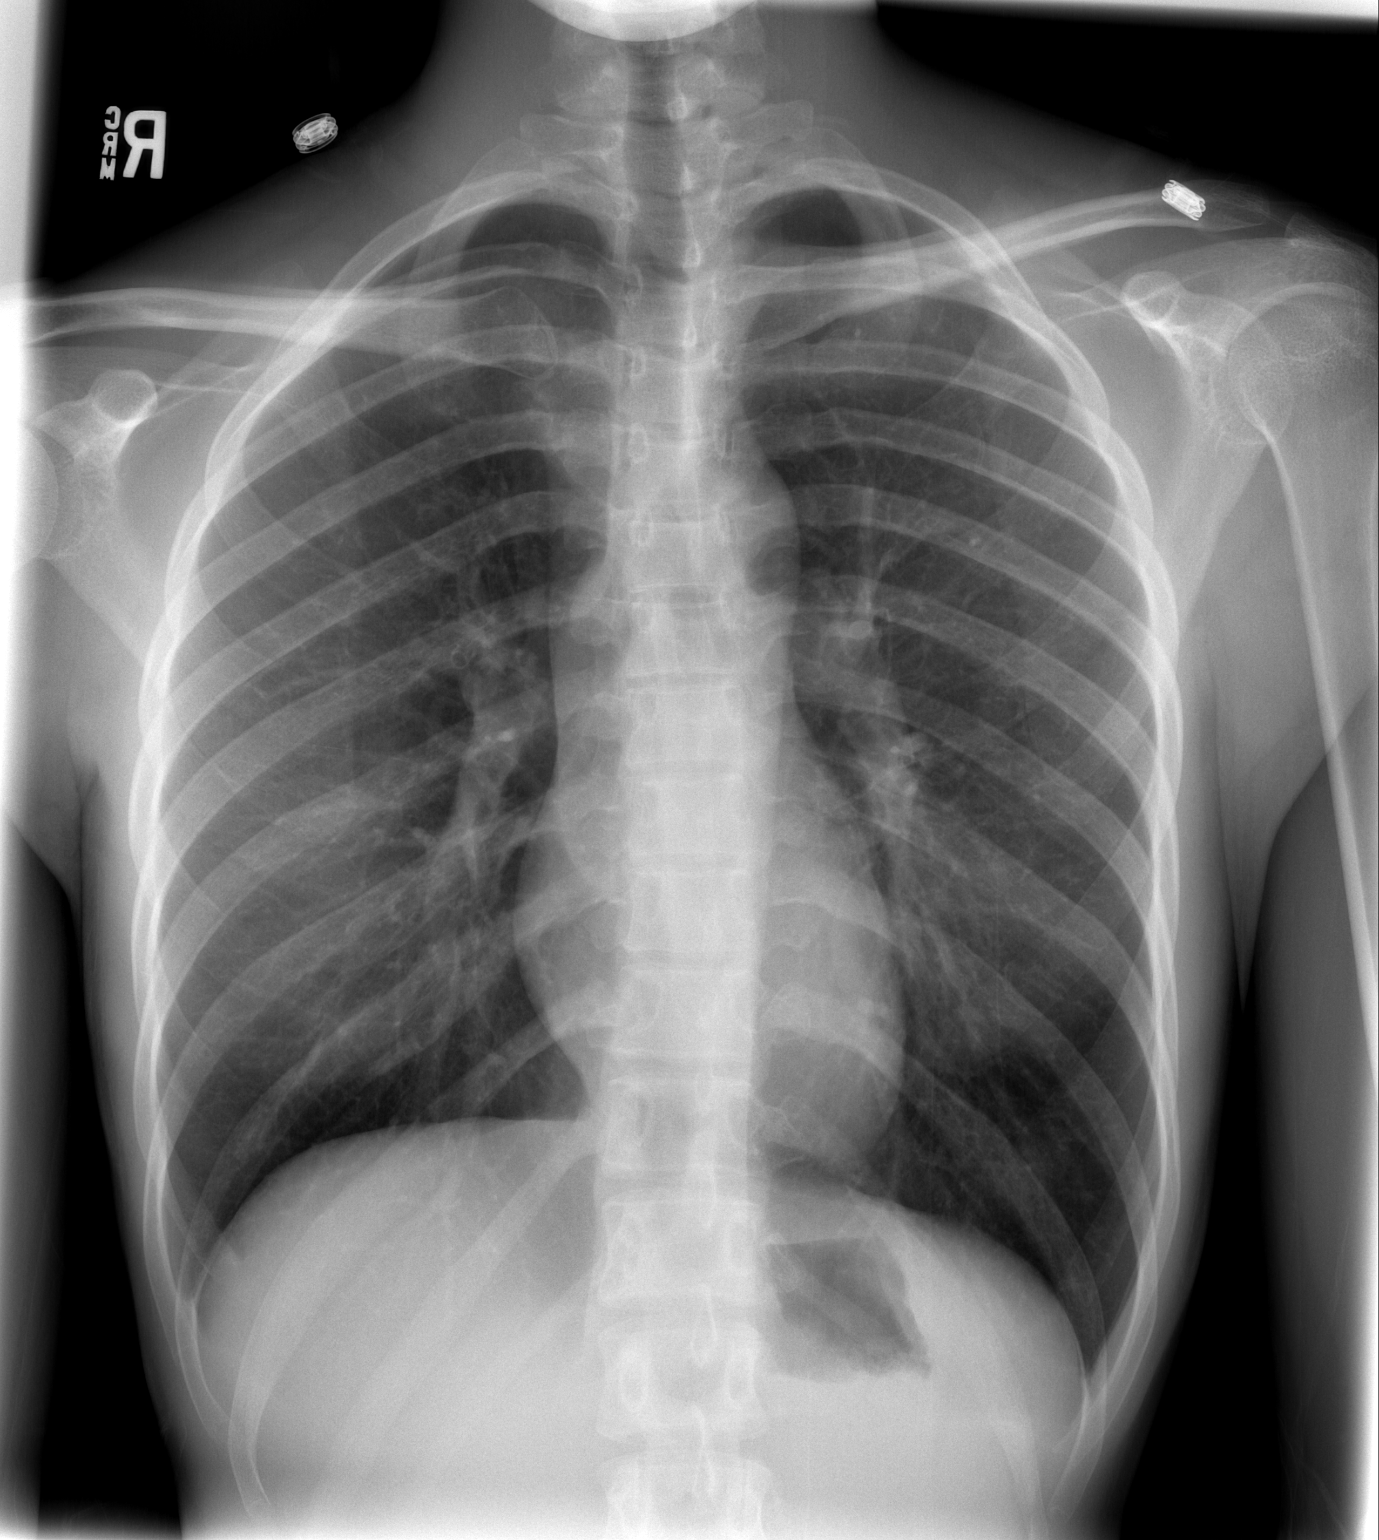

[w chest lat]
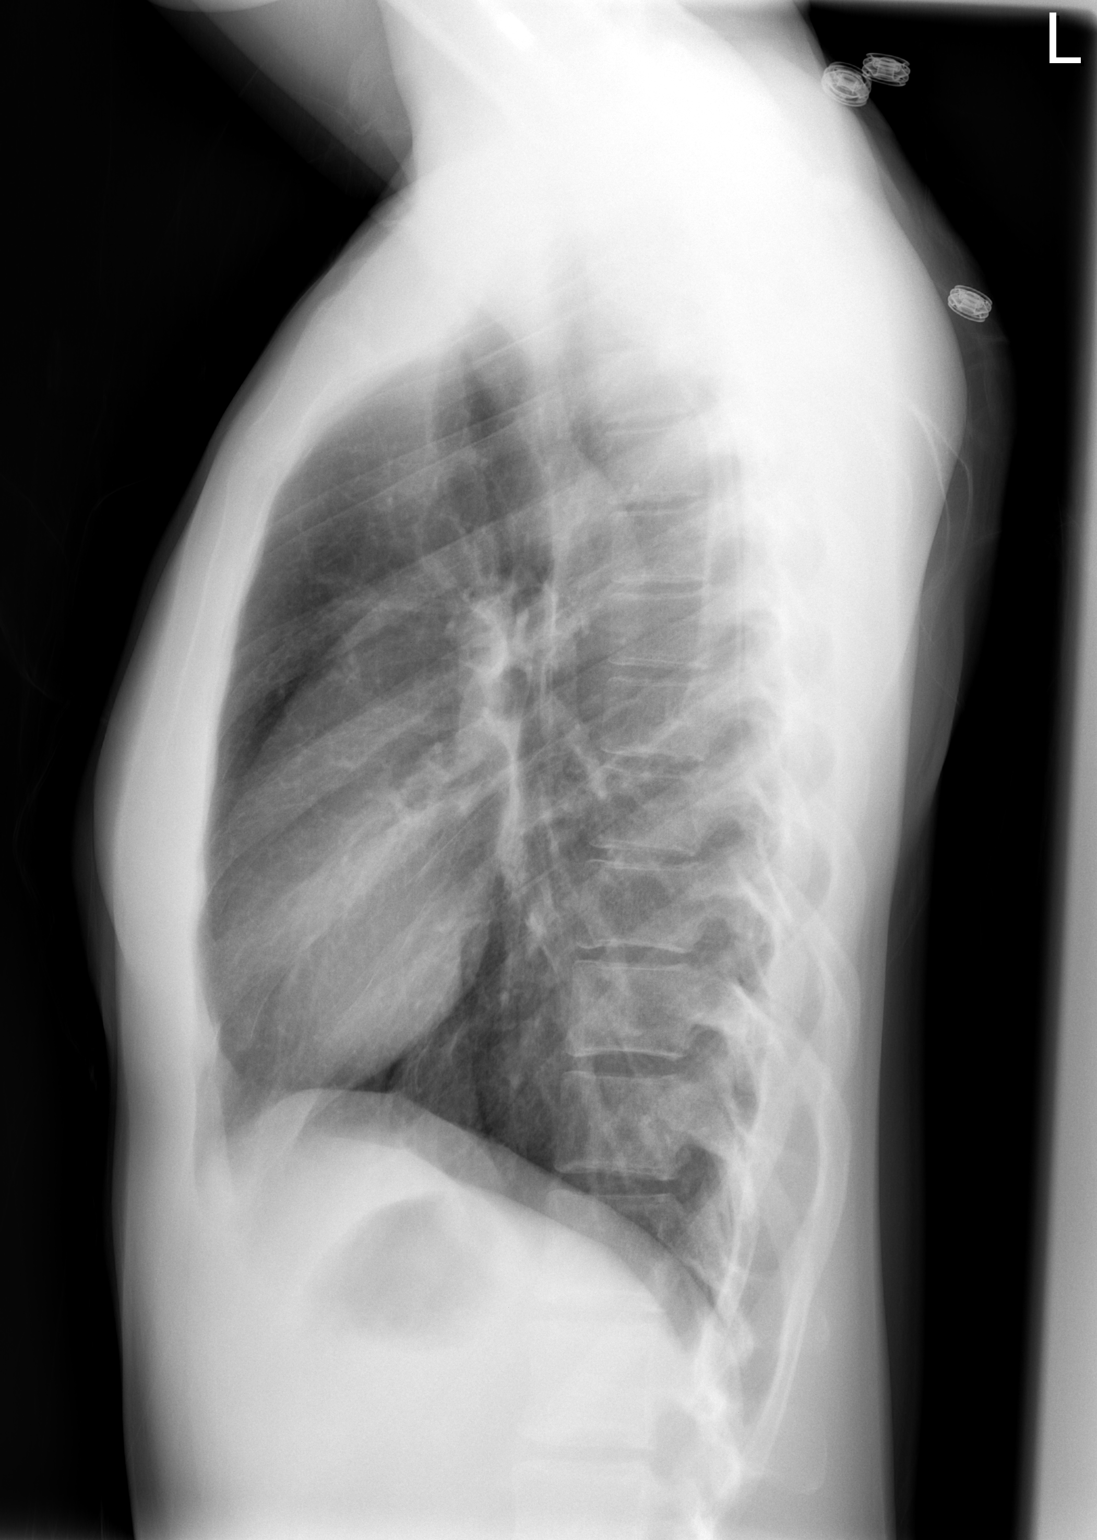

[2 of 2 positions shown; findings below may reference images not displayed]

FINDINGS: The lungs are well-aerated and clear. There is no evidence of focal
opacification, pleural effusion or pneumothorax.

The heart is normal in size; the mediastinal contour is within
normal limits. No acute osseous abnormalities are seen.
IMPRESSION: No acute cardiopulmonary process seen.

## 2016-01-21 ENCOUNTER — Inpatient Hospital Stay (HOSPITAL_COMMUNITY)
Admission: AD | Admit: 2016-01-21 | Discharge: 2016-01-21 | Disposition: A | Discharge status: Home / Self Care | Payer: Medicaid Other | Source: Ambulatory Visit | Attending: Obstetrics and Gynecology | Admitting: Obstetrics and Gynecology

## 2016-01-21 DIAGNOSIS — T189XXA Foreign body of alimentary tract, part unspecified, initial encounter: Secondary | ICD-10-CM

## 2016-01-21 DIAGNOSIS — X58XXXA Exposure to other specified factors, initial encounter: Secondary | ICD-10-CM | POA: Insufficient documentation

## 2016-01-21 NOTE — MAU Provider Note (Signed)
History   Chief Complaint:  Swallowed Foreign Body   Barbara Mclean is  20 y.o. G1P1001 Patient's last menstrual period was 12/18/2015.Marland Kitchen. Patient is here because she accidentally swallowed her tongue ring. She swallowed the bar and ball (one Piece). She reports no difficulty swallowing or breathing. General ROS:  negative      Physical Exam   Blood pressure 120/77, pulse 81, temperature 97.5 F (36.4 C), resp. rate 18, height 5\' 5"  (1.651 m), weight 117 lb 9.6 oz (53.343 kg), last menstrual period 12/18/2015, SpO2 100 %, not currently breastfeeding.  Physical Examination: General appearance - alert, well appearing, and in no distress Mouth - mucous membranes moist, pharynx normal without lesions Neck - supple, no significant adenopathy Heart - normal rate, regular rhythm, normal S1, S2, no murmurs, rubs, clicks or gallops Abdomen - soft, nontender, nondistended, no masses or organomegaly Neurological - alert, oriented, normal speech, no focal findings or movement disorder noted Extremities - peripheral pulses normal, no pedal edema, no clubbing or cyanosis   Assessment: Swallowed Foreign Body   Plan: Anticipate foreign body to come out during bowel movments Pt may monitor stool for tongue ring Return to [facility] if abdominal pain or rectal bleeding occurs Discharge    Chaundra Abreu Grissett 01/21/2016, 8:42 PM

## 2016-01-21 NOTE — MAU Note (Signed)
Swallowed my tongue ring this afternoon. Swallowed the bar and one ball. Feel like it is still in my throat.

## 2016-01-21 NOTE — Discharge Instructions (Signed)
Swallowed Foreign Body, Adult °A swallowed foreign body means that you swallow something and it gets stuck. It might be food or something else. The object may get stuck in the tube that connects your throat to your stomach (esophagus), or it may get stuck in another part of your belly (digestive tract). It is very important to tell your doctor what you have swallowed. °Sometimes, the object will pass through your body on its own. Sometimes, the object will pass after you are given a medicine to relax your throat. The object may need to be taken out by a doctor if it is dangerous or if it will not pass through your body on its own. An object may need to be removed with surgery if: °· It gets stuck in your throat. °· You cannot swallow. °· You cannot breathe well. °· It is sharp. °· It is harmful or poisonous (toxic), like batteries or drugs. °HOME CARE °· Eat what you normally eat if your doctor says that you can. °· Keep checking your poop (stool) to see if the object has come out of your body. °· Call your doctor if the object has not come out of your body after 3 days. °If you had surgery (endoscopy) to remove the object: °· Care for yourself after surgery as told by your doctor. °Keep all follow-up visits as told by your doctor. This is important. °SEEK MEDICAL CARE IF: °· You still have problems after you have been treated. °· The object has not come out of your body after 3 days. °GET HELP RIGHT AWAY IF: °· You have a fever. °· You have pain in your chest or your belly. °· You cough up blood. °· You have blood in your poop or your throw up (vomit). °  °This information is not intended to replace advice given to you by your health care provider. Make sure you discuss any questions you have with your health care provider. °  °Document Released: 12/06/2010 Document Revised: 05/12/2015 Document Reviewed: 11/18/2014 °Elsevier Interactive Patient Education ©2016 [employer] ° °

## 2016-01-21 NOTE — MAU Note (Signed)
Lori Clemmons CNM in Triage to discuss plan of care with pt. Pt d/c home from Triage.

## 2016-08-08 ENCOUNTER — Ambulatory Visit (INDEPENDENT_AMBULATORY_CARE_PROVIDER_SITE_OTHER): Payer: Medicaid Other | Admitting: Adult Health

## 2016-08-08 ENCOUNTER — Encounter: Payer: Self-pay | Admitting: Adult Health

## 2016-08-08 ENCOUNTER — Encounter (INDEPENDENT_AMBULATORY_CARE_PROVIDER_SITE_OTHER): Payer: Self-pay

## 2016-08-08 VITALS — BP 100/72 | HR 80 | Ht 65.0 in | Wt 118.6 lb

## 2016-08-08 DIAGNOSIS — Z3201 Encounter for pregnancy test, result positive: Secondary | ICD-10-CM | POA: Diagnosis not present

## 2016-08-08 DIAGNOSIS — N926 Irregular menstruation, unspecified: Secondary | ICD-10-CM

## 2016-08-08 DIAGNOSIS — R110 Nausea: Secondary | ICD-10-CM | POA: Insufficient documentation

## 2016-08-08 DIAGNOSIS — R252 Cramp and spasm: Secondary | ICD-10-CM | POA: Diagnosis not present

## 2016-08-08 DIAGNOSIS — Z3490 Encounter for supervision of normal pregnancy, unspecified, unspecified trimester: Secondary | ICD-10-CM

## 2016-08-08 DIAGNOSIS — O3680X0 Pregnancy with inconclusive fetal viability, not applicable or unspecified: Secondary | ICD-10-CM

## 2016-08-08 LAB — POCT URINE PREGNANCY: Preg Test, Ur: POSITIVE — AB

## 2016-08-08 MED ORDER — PRENATAL PLUS 27-1 MG PO TABS: 1.0000 | ORAL_TABLET | Freq: Every day | ORAL | 11 refills | Status: DC

## 2016-08-08 MED ORDER — PROMETHAZINE HCL 25 MG PO TABS: 25.0000 mg | ORAL_TABLET | Freq: Four times a day (QID) | ORAL | 1 refills | Status: DC | PRN

## 2016-08-08 NOTE — Progress Notes (Signed)
Subjective:     Patient ID: Barbara Mclean, female   DOB: Dec 31, 1995, 20 y.o.   MRN: [id number]  HPI Barbara Mclean is a 20 year old black female in for UPT, has missed periods and has nausea and some cramps, no bleeding.  Review of Systems  +missed period +cramps, no bleeding + nausea Reviewed past medical,surgical, social and family history. Reviewed medications and allergies.     Objective:   Physical Exam BP 100/72   Pulse 80   Ht 5\' 5"  (1.651 m)   Wt 118 lb 9.6 oz (53.8 kg)   LMP 05/19/2016   Breastfeeding? No   BMI 19.74 kg/m UPT+, about 11 +5 weeks by LMP 05/19/16, EDD 02/23/17, Skin warm and dry. Neck: mid line trachea, normal thyroid, good ROM, no lymphadenopathy noted. Lungs: clear to ausculation bilaterally. Cardiovascular: regular rate and rhythm.Abdomen is soft and non tender.She is not sure about period, so will get US scheduled. PHQ 2 score 1.     Assessment:        1. Pregnancy test positive   2. Pregnancy, unspecified gestational age   513. Encounter to determine fetal viability of pregnancy, single or unspecified fetus   4. Nausea    Plan:     Meds ordered this encounter  Medications  . prenatal vitamin w/FE, FA (PRENATAL 1 + 1) 27-1 MG TABS tablet    Sig: Take 1 tablet by mouth daily at 12 noon.    Dispense:  30 each    Refill:  11    Order Specific Question:   Supervising Provider    Answer:   Despina HiddenEURE, LUTHER H [[id number]]  . promethazine (PHENERGAN) 25 MG tablet    Sig: Take 1 tablet (25 mg total) by mouth every 6 (six) hours as needed for nausea or vomiting.    Dispense:  30 tablet    Refill:  1    Order Specific Question:   Supervising Provider    Answer:   Lazaro ArmsEURE, LUTHER H [[id number]]  Return in 1 week for dating US  Review handout on first trimester

## 2016-08-08 NOTE — Patient Instructions (Signed)
First Trimester of Pregnancy  The first trimester of pregnancy is from week 1 until the end of week 12 (months 1 through 3). A week after a sperm fertilizes an egg, the egg will implant on the wall of the uterus. This embryo will begin to develop into a baby. Genes from you and your partner are forming the baby. The female genes determine whether the baby is a boy or a girl. At 6-8 weeks, the eyes and face are formed, and the heartbeat can be seen on ultrasound. At the end of 12 weeks, all the baby's organs are formed.   Now that you are pregnant, you will want to do everything you can to have a healthy baby. Two of the most important things are to get good prenatal care and to follow your health care provider's instructions. Prenatal care is all the medical care you receive before the baby's birth. This care will help prevent, find, and treat any problems during the pregnancy and childbirth.  BODY CHANGES  Your body goes through many changes during pregnancy. The changes vary from woman to woman.   · You may gain or lose a couple of pounds at first.  · You may feel sick to your stomach (nauseous) and throw up (vomit). If the vomiting is uncontrollable, call your health care provider.  · You may tire easily.  · You may develop headaches that can be relieved by medicines approved by your health care provider.  · You may urinate more often. Painful urination may mean you have a bladder infection.  · You may develop heartburn as a result of your pregnancy.  · You may develop constipation because certain hormones are causing the muscles that push waste through your intestines to slow down.  · You may develop hemorrhoids or swollen, bulging veins (varicose veins).  · Your breasts may begin to grow larger and become tender. Your nipples may stick out more, and the tissue that surrounds them (areola) may become darker.  · Your gums may bleed and may be sensitive to brushing and flossing.   · Dark spots or blotches (chloasma, mask of pregnancy) may develop on your face. This will likely fade after the baby is born.  · Your menstrual periods will stop.  · You may have a loss of appetite.  · You may develop cravings for certain kinds of food.  · You may have changes in your emotions from day to day, such as being excited to be pregnant or being concerned that something may go wrong with the pregnancy and baby.  · You may have more vivid and strange dreams.  · You may have changes in your hair. These can include thickening of your hair, rapid growth, and changes in texture. Some women also have hair loss during or after pregnancy, or hair that feels dry or thin. Your hair will most likely return to normal after your baby is born.  WHAT TO EXPECT AT YOUR PRENATAL VISITS  During a routine prenatal visit:  · You will be weighed to make sure you and the baby are growing normally.  · Your blood pressure will be taken.  · Your abdomen will be measured to track your baby's growth.  · The fetal heartbeat will be listened to starting around week 10 or 12 of your pregnancy.  · Test results from any previous visits will be discussed.  Your health care provider may ask you:  · How you are feeling.  · If you   are feeling the baby move.  · If you have had any abnormal symptoms, such as leaking fluid, bleeding, severe headaches, or abdominal cramping.  · If you are using any tobacco products, including cigarettes, chewing tobacco, and electronic cigarettes.  · If you have any questions.  Other tests that may be performed during your first trimester include:  · Blood tests to find your blood type and to check for the presence of any previous infections. They will also be used to check for low iron levels (anemia) and Rh antibodies. Later in the pregnancy, blood tests for diabetes will be done along with other tests if problems develop.  · Urine tests to check for infections, diabetes, or protein in the urine.   · An ultrasound to confirm the proper growth and development of the baby.  · An amniocentesis to check for possible genetic problems.  · Fetal screens for spina bifida and Down syndrome.  · You may need other tests to make sure you and the baby are doing well.  · HIV (human immunodeficiency virus) testing. Routine prenatal testing includes screening for HIV, unless you choose not to have this test.  HOME CARE INSTRUCTIONS   Medicines  · Follow your health care provider's instructions regarding medicine use. Specific medicines may be either safe or unsafe to take during pregnancy.  · Take your prenatal vitamins as directed.  · If you develop constipation, try taking a stool softener if your health care provider approves.  Diet  · Eat regular, well-balanced meals. Choose a variety of foods, such as meat or vegetable-based protein, fish, milk and low-fat dairy products, vegetables, fruits, and whole grain breads and cereals. Your health care provider will help you determine the amount of weight gain that is right for you.  · Avoid raw meat and uncooked cheese. These carry germs that can cause birth defects in the baby.  · Eating four or five small meals rather than three large meals a day may help relieve nausea and vomiting. If you start to feel nauseous, eating a few soda crackers can be helpful. Drinking liquids between meals instead of during meals also seems to help nausea and vomiting.  · If you develop constipation, eat more high-fiber foods, such as fresh vegetables or fruit and whole grains. Drink enough fluids to keep your urine clear or pale yellow.  Activity and Exercise  · Exercise only as directed by your health care provider. Exercising will help you:    Control your weight.    Stay in shape.    Be prepared for labor and delivery.  · Experiencing pain or cramping in the lower abdomen or low back is a good sign that you should stop exercising. Check with your health care provider  before continuing normal exercises.  · Try to avoid standing for long periods of time. Move your legs often if you must stand in one place for a long time.  · Avoid heavy lifting.  · Wear low-heeled shoes, and practice good posture.  · You may continue to have sex unless your health care provider directs you otherwise.  Relief of Pain or Discomfort  · Wear a good support bra for breast tenderness.    · Take warm sitz baths to soothe any pain or discomfort caused by hemorrhoids. Use hemorrhoid cream if your health care provider approves.    · Rest with your legs elevated if you have leg cramps or low back pain.  · If you develop varicose veins in your   legs, wear support hose. Elevate your feet for 15 minutes, 3-4 times a day. Limit salt in your diet.  Prenatal Care  · Schedule your prenatal visits by the twelfth week of pregnancy. They are usually scheduled monthly at first, then more often in the last 2 months before delivery.  · Write down your questions. Take them to your prenatal visits.  · Keep all your prenatal visits as directed by your health care provider.  Safety  · Wear your seat belt at all times when driving.  · Make a list of emergency phone numbers, including numbers for family, friends, the hospital, and police and fire departments.  General Tips  · Ask your health care provider for a referral to a local prenatal education class. Begin classes no later than at the beginning of month 6 of your pregnancy.  · Ask for help if you have counseling or nutritional needs during pregnancy. Your health care provider can offer advice or refer you to specialists for help with various needs.  · Do not use hot tubs, steam rooms, or saunas.  · Do not douche or use tampons or scented sanitary pads.  · Do not cross your legs for long periods of time.  · Avoid cat litter boxes and soil used by cats. These carry germs that can cause birth defects in the baby and possibly loss of the fetus by miscarriage or stillbirth.   · Avoid all smoking, herbs, alcohol, and medicines not prescribed by your health care provider. Chemicals in these affect the formation and growth of the baby.  · Do not use any tobacco products, including cigarettes, chewing tobacco, and electronic cigarettes. If you need help quitting, ask your health care provider. You may receive counseling support and other resources to help you quit.  · Schedule a dentist appointment. At home, brush your teeth with a soft toothbrush and be gentle when you floss.  SEEK MEDICAL CARE IF:   · You have dizziness.  · You have mild pelvic cramps, pelvic pressure, or nagging pain in the abdominal area.  · You have persistent nausea, vomiting, or diarrhea.  · You have a bad smelling vaginal discharge.  · You have pain with urination.  · You notice increased swelling in your face, hands, legs, or ankles.  SEEK IMMEDIATE MEDICAL CARE IF:   · You have a fever.  · You are leaking fluid from your vagina.  · You have spotting or bleeding from your vagina.  · You have severe abdominal cramping or pain.  · You have rapid weight gain or loss.  · You vomit blood or material that looks like coffee grounds.  · You are exposed to German measles and have never had them.  · You are exposed to fifth disease or chickenpox.  · You develop a severe headache.  · You have shortness of breath.  · You have any kind of trauma, such as from a fall or a car accident.     This information is not intended to replace advice given to you by your health care provider. Make sure you discuss any questions you have with your health care provider.     Document Released: 08/15/2001 Document Revised: 09/11/2014 Document Reviewed: 07/01/2013  Elsevier Interactive Patient Education ©2017 [employer]

## 2016-08-15 ENCOUNTER — Other Ambulatory Visit: Payer: Self-pay | Admitting: Adult Health

## 2016-08-15 ENCOUNTER — Ambulatory Visit (INDEPENDENT_AMBULATORY_CARE_PROVIDER_SITE_OTHER): Payer: Medicaid Other

## 2016-08-15 ENCOUNTER — Other Ambulatory Visit: Payer: Self-pay

## 2016-08-15 DIAGNOSIS — Z3A14 14 weeks gestation of pregnancy: Secondary | ICD-10-CM | POA: Diagnosis not present

## 2016-08-15 DIAGNOSIS — Z3682 Encounter for antenatal screening for nuchal translucency: Secondary | ICD-10-CM

## 2016-08-15 DIAGNOSIS — O3680X0 Pregnancy with inconclusive fetal viability, not applicable or unspecified: Secondary | ICD-10-CM

## 2016-08-15 NOTE — Progress Notes (Addendum)
US 13+5 wks,single IUP, pos fht 144 bpm,normal ov's bilat,post pl,NB present,NT 2 mm,crl 76.7 mm,EDD 02/15/2017 by ultrasound

## 2016-08-18 LAB — MATERNAL SCREEN, INTEGRATED #1
Crown Rump Length: 76.7 mm
Gest. Age on Collection Date: 13.3 weeks
Maternal Age at EDD: 23.3 years
NUCHAL TRANSLUCENCY (NT): 2 mm
Number of Fetuses: 1
PAPP-A VALUE: 344.3 ng/mL
Weight: 119 [lb_av]

## 2016-08-24 ENCOUNTER — Encounter: Payer: Self-pay | Admitting: Advanced Practice Midwife

## 2016-08-24 ENCOUNTER — Ambulatory Visit (INDEPENDENT_AMBULATORY_CARE_PROVIDER_SITE_OTHER): Payer: Medicaid Other | Admitting: Advanced Practice Midwife

## 2016-08-24 VITALS — BP 100/62 | HR 82 | Wt 121.0 lb

## 2016-08-24 DIAGNOSIS — O0930 Supervision of pregnancy with insufficient antenatal care, unspecified trimester: Secondary | ICD-10-CM

## 2016-08-24 DIAGNOSIS — Z3482 Encounter for supervision of other normal pregnancy, second trimester: Secondary | ICD-10-CM | POA: Diagnosis not present

## 2016-08-24 DIAGNOSIS — Z3A15 15 weeks gestation of pregnancy: Secondary | ICD-10-CM

## 2016-08-24 DIAGNOSIS — Z1389 Encounter for screening for other disorder: Secondary | ICD-10-CM

## 2016-08-24 DIAGNOSIS — O0932 Supervision of pregnancy with insufficient antenatal care, second trimester: Secondary | ICD-10-CM

## 2016-08-24 DIAGNOSIS — Z363 Encounter for antenatal screening for malformations: Secondary | ICD-10-CM

## 2016-08-24 DIAGNOSIS — Z3481 Encounter for supervision of other normal pregnancy, first trimester: Secondary | ICD-10-CM

## 2016-08-24 DIAGNOSIS — Z331 Pregnant state, incidental: Secondary | ICD-10-CM

## 2016-08-24 LAB — POCT URINALYSIS DIPSTICK
GLUCOSE UA: NEGATIVE
KETONES UA: NEGATIVE
LEUKOCYTES UA: NEGATIVE
Nitrite, UA: NEGATIVE
Protein, UA: NEGATIVE

## 2016-08-24 MED ORDER — ONDANSETRON HCL 4 MG PO TABS: 4.0000 mg | ORAL_TABLET | Freq: Three times a day (TID) | ORAL | 1 refills | Status: DC | PRN

## 2016-08-24 NOTE — Patient Instructions (Signed)
 First Trimester of Pregnancy The first trimester of pregnancy is from week 1 until the end of week 12 (months 1 through 3). A week after a sperm fertilizes an egg, the egg will implant on the wall of the uterus. This embryo will begin to develop into a baby. Genes from you and your partner are forming the baby. The female genes determine whether the baby is a boy or a girl. At 6-8 weeks, the eyes and face are formed, and the heartbeat can be seen on ultrasound. At the end of 12 weeks, all the baby's organs are formed.  Now that you are pregnant, you will want to do everything you can to have a healthy baby. Two of the most important things are to get good prenatal care and to follow your health care provider's instructions. Prenatal care is all the medical care you receive before the baby's birth. This care will help prevent, find, and treat any problems during the pregnancy and childbirth. BODY CHANGES Your body goes through many changes during pregnancy. The changes vary from woman to woman.   You may gain or lose a couple of pounds at first.  You may feel sick to your stomach (nauseous) and throw up (vomit). If the vomiting is uncontrollable, call your health care provider.  You may tire easily.  You may develop headaches that can be relieved by medicines approved by your health care provider.  You may urinate more often. Painful urination may mean you have a bladder infection.  You may develop heartburn as a result of your pregnancy.  You may develop constipation because certain hormones are causing the muscles that push waste through your intestines to slow down.  You may develop hemorrhoids or swollen, bulging veins (varicose veins).  Your breasts may begin to grow larger and become tender. Your nipples may stick out more, and the tissue that surrounds them (areola) may become darker.  Your gums may bleed and may be sensitive to brushing and flossing.  Dark spots or blotches  (chloasma, mask of pregnancy) may develop on your face. This will likely fade after the baby is born.  Your menstrual periods will stop.  You may have a loss of appetite.  You may develop cravings for certain kinds of food.  You may have changes in your emotions from day to day, such as being excited to be pregnant or being concerned that something may go wrong with the pregnancy and baby.  You may have more vivid and strange dreams.  You may have changes in your hair. These can include thickening of your hair, rapid growth, and changes in texture. Some women also have hair loss during or after pregnancy, or hair that feels dry or thin. Your hair will most likely return to normal after your baby is born. WHAT TO EXPECT AT YOUR PRENATAL VISITS During a routine prenatal visit:  You will be weighed to make sure you and the baby are growing normally.  Your blood pressure will be taken.  Your abdomen will be measured to track your baby's growth.  The fetal heartbeat will be listened to starting around week 10 or 12 of your pregnancy.  Test results from any previous visits will be discussed. Your health care provider may ask you:  How you are feeling.  If you are feeling the baby move.  If you have had any abnormal symptoms, such as leaking fluid, bleeding, severe headaches, or abdominal cramping.  If you have any questions. Other   tests that may be performed during your first trimester include:  Blood tests to find your blood type and to check for the presence of any previous infections. They will also be used to check for low iron levels (anemia) and Rh antibodies. Later in the pregnancy, blood tests for diabetes will be done along with other tests if problems develop.  Urine tests to check for infections, diabetes, or protein in the urine.  An ultrasound to confirm the proper growth and development of the baby.  An amniocentesis to check for possible genetic problems.  Fetal  screens for spina bifida and Down syndrome.  You may need other tests to make sure you and the baby are doing well. HOME CARE INSTRUCTIONS  Medicines  Follow your health care provider's instructions regarding medicine use. Specific medicines may be either safe or unsafe to take during pregnancy.  Take your prenatal vitamins as directed.  If you develop constipation, try taking a stool softener if your health care provider approves. Diet  Eat regular, well-balanced meals. Choose a variety of foods, such as meat or vegetable-based protein, fish, milk and low-fat dairy products, vegetables, fruits, and whole grain breads and cereals. Your health care provider will help you determine the amount of weight gain that is right for you.  Avoid raw meat and uncooked cheese. These carry germs that can cause birth defects in the baby.  Eating four or five small meals rather than three large meals a day may help relieve nausea and vomiting. If you start to feel nauseous, eating a few soda crackers can be helpful. Drinking liquids between meals instead of during meals also seems to help nausea and vomiting.  If you develop constipation, eat more high-fiber foods, such as fresh vegetables or fruit and whole grains. Drink enough fluids to keep your urine clear or pale yellow. Activity and Exercise  Exercise only as directed by your health care provider. Exercising will help you:  Control your weight.  Stay in shape.  Be prepared for labor and delivery.  Experiencing pain or cramping in the lower abdomen or low back is a good sign that you should stop exercising. Check with your health care provider before continuing normal exercises.  Try to avoid standing for long periods of time. Move your legs often if you must stand in one place for a long time.  Avoid heavy lifting.  Wear low-heeled shoes, and practice good posture.  You may continue to have sex unless your health care provider directs you  otherwise. Relief of Pain or Discomfort  Wear a good support bra for breast tenderness.   Take warm sitz baths to soothe any pain or discomfort caused by hemorrhoids. Use hemorrhoid cream if your health care provider approves.   Rest with your legs elevated if you have leg cramps or low back pain.  If you develop varicose veins in your legs, wear support hose. Elevate your feet for 15 minutes, 3-4 times a day. Limit salt in your diet. Prenatal Care  Schedule your prenatal visits by the twelfth week of pregnancy. They are usually scheduled monthly at first, then more often in the last 2 months before delivery.  Write down your questions. Take them to your prenatal visits.  Keep all your prenatal visits as directed by your health care provider. Safety  Wear your seat belt at all times when driving.  Make a list of emergency phone numbers, including numbers for family, friends, the hospital, and police and fire departments. General   Tips  Ask your health care provider for a referral to a local prenatal education class. Begin classes no later than at the beginning of month 6 of your pregnancy.  Ask for help if you have counseling or nutritional needs during pregnancy. Your health care provider can offer advice or refer you to specialists for help with various needs.  Do not use hot tubs, steam rooms, or saunas.  Do not douche or use tampons or scented sanitary pads.  Do not cross your legs for long periods of time.  Avoid cat litter boxes and soil used by cats. These carry germs that can cause birth defects in the baby and possibly loss of the fetus by miscarriage or stillbirth.  Avoid all smoking, herbs, alcohol, and medicines not prescribed by your health care provider. Chemicals in these affect the formation and growth of the baby.  Schedule a dentist appointment. At home, brush your teeth with a soft toothbrush and be gentle when you floss. SEEK MEDICAL CARE IF:   You have  dizziness.  You have mild pelvic cramps, pelvic pressure, or nagging pain in the abdominal area.  You have persistent nausea, vomiting, or diarrhea.  You have a bad smelling vaginal discharge.  You have pain with urination.  You notice increased swelling in your face, hands, legs, or ankles. SEEK IMMEDIATE MEDICAL CARE IF:   You have a fever.  You are leaking fluid from your vagina.  You have spotting or bleeding from your vagina.  You have severe abdominal cramping or pain.  You have rapid weight gain or loss.  You vomit blood or material that looks like coffee grounds.  You are exposed to German measles and have never had them.  You are exposed to fifth disease or chickenpox.  You develop a severe headache.  You have shortness of breath.  You have any kind of trauma, such as from a fall or a car accident. Document Released: 08/15/2001 Document Revised: 01/05/2014 Document Reviewed: 07/01/2013 [employer] Patient Information 2015 [employer]. This information is not intended to replace advice given to you by your health care provider. Make sure you discuss any questions you have with your health care provider.   Nausea & Vomiting  Have saltine crackers or pretzels by your bed and eat a few bites before you raise your head out of bed in the morning  Eat small frequent meals throughout the day instead of large meals  Drink plenty of fluids throughout the day to stay hydrated, just don't drink a lot of fluids with your meals.  This can make your stomach fill up faster making you feel sick  Do not brush your teeth right after you eat  Products with real ginger are good for nausea, like ginger ale and ginger hard candy Make sure it says made with real ginger!  Sucking on sour candy like lemon heads is also good for nausea  If your prenatal vitamins make you nauseated, take them at night so you will sleep through the nausea  Sea Bands  If you feel like you need  medicine for the nausea & vomiting please let us know  If you are unable to keep any fluids or food down please let us know   Constipation  Drink plenty of fluid, preferably water, throughout the day  Eat foods high in fiber such as fruits, vegetables, and grains  Exercise, such as walking, is a good way to keep your bowels regular  Drink warm fluids, especially warm   prune juice, or decaf coffee  Eat a 1/2 cup of real oatmeal (not instant), 1/2 cup applesauce, and 1/2-1 cup warm prune juice every day  If needed, you may take Colace (docusate sodium) stool softener once or twice a day to help keep the stool soft. If you are pregnant, wait until you are out of your first trimester (12-14 weeks of pregnancy)  If you still are having problems with constipation, you may take Miralax once daily as needed to help keep your bowels regular.  If you are pregnant, wait until you are out of your first trimester (12-14 weeks of pregnancy)  Safe Medications in Pregnancy   Acne: Benzoyl Peroxide Salicylic Acid  Backache/Headache: Tylenol: 2 regular strength every 4 hours OR              2 Extra strength every 6 hours  Colds/Coughs/Allergies: Benadryl (alcohol free) 25 mg every 6 hours as needed Breath right strips Claritin Cepacol throat lozenges Chloraseptic throat spray Cold-Eeze- up to three times per day Cough drops, alcohol free Flonase (by prescription only) Guaifenesin Mucinex Robitussin DM (plain only, alcohol free) Saline nasal spray/drops Sudafed (pseudoephedrine) & Actifed ** use only after [redacted] weeks gestation and if you do not have high blood pressure Tylenol Vicks Vaporub Zinc lozenges Zyrtec   Constipation: Colace Ducolax suppositories Fleet enema Glycerin suppositories Metamucil Milk of magnesia Miralax Senokot Smooth move tea  Diarrhea: Kaopectate Imodium A-D  *NO pepto Bismol  Hemorrhoids: Anusol Anusol HC Preparation  H Tucks  Indigestion: Tums Maalox Mylanta Zantac  Pepcid  Insomnia: Benadryl (alcohol free) 25mg every 6 hours as needed Tylenol PM Unisom, no Gelcaps  Leg Cramps: Tums MagGel  Nausea/Vomiting:  Bonine Dramamine Emetrol Ginger extract Sea bands Meclizine  Nausea medication to take during pregnancy:  Unisom (doxylamine succinate 25 mg tablets) Take one tablet daily at bedtime. If symptoms are not adequately controlled, the dose can be increased to a maximum recommended dose of two tablets daily (1/2 tablet in the morning, 1/2 tablet mid-afternoon and one at bedtime). Vitamin B6 100mg tablets. Take one tablet twice a day (up to 200 mg per day).  Skin Rashes: Aveeno products Benadryl cream or 25mg every 6 hours as needed Calamine Lotion 1% cortisone cream  Yeast infection: Gyne-lotrimin 7 Monistat 7   **If taking multiple medications, please check labels to avoid duplicating the same active ingredients **take medication as directed on the label ** Do not exceed 4000 mg of tylenol in 24 hours **Do not take medications that contain aspirin or ibuprofen      

## 2016-08-24 NOTE — Progress Notes (Signed)
  Subjective:    Barbara Mclean is a G2P1001 7242w0d being seen today for her first obstetrical visit.  Her obstetrical history is significant for term SVD 1 year ago. .  Pregnancy history fully reviewed.  Patient reports nausea.  Vitals:   08/24/16 1037  BP: 100/62  Pulse: 82  Weight: 121 lb (54.9 kg)    HISTORY: OB History  Gravida Para Term Preterm AB Living  2 1 1     1   SAB TAB Ectopic Multiple Live Births        0 1    # Outcome Date GA Lbr Len/2nd Weight Sex Delivery Anes PTL Lv  2 Current           1 Term 04/12/15 476w5d 08:28 / 00:28 6 lb 15.3 oz (3.155 kg) F Vag-Spont EPI  LIV     Past Medical History:  Diagnosis Date  . Chlamydia   . IBS (irritable bowel syndrome)   . URI (upper respiratory infection) 11/06/14   Past Surgical History:  Procedure Laterality Date  . NO PAST SURGERIES     Family History  Problem Relation Age of Onset  . Anemia Mother   . Diabetes Maternal Grandmother   . Hypertension Maternal Grandmother   . Cancer Maternal Grandfather     bone     Exam                                      System:     Skin: normal coloration and turgor, no rashes    Neurologic: oriented, normal, normal mood   Extremities: normal strength, tone, and muscle mass   HEENT PERRLA   Mouth/Teeth mucous membranes moist, normal dentition   Neck supple and no masses   Cardiovascular: regular rate and rhythm   Respiratory:  appears well, vitals normal, no respiratory distress, acyanotic   Abdomen: soft, non-tender;  FHR: 150 US          Assessment:    Pregnancy: G2P1001 Patient Active Problem List   Diagnosis Date Noted  . Supervision of normal intrauterine pregnancy in multigravida, second trimester 08/24/2016  . Pregnancy 08/08/2016  . 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 Trichomonas infection 11/11/2014  . IBS (irritable bowel syndrome) 10/28/2014        Plan:     Initial labs drawn. Continue prenatal vitamins  Rx zofran Problem list  reviewed and updated  Reviewed n/v relief measures and warning s/s to report  Reviewed recommended weight gain based on pre-gravid BMI  Encouraged well-balanced diet Genetic Screening discussed Integrated Screen: requested.  Ultrasound discussed; fetal survey: requested.  Return in about 4 weeks (around 09/21/2016) for LROB, ZD:GUYQIHKS:Anatomy.  CRESENZO-DISHMAN,Renell Coaxum 08/24/2016

## 2016-08-25 LAB — URINALYSIS, ROUTINE W REFLEX MICROSCOPIC
Bilirubin, UA: NEGATIVE
GLUCOSE, UA: NEGATIVE
Leukocytes, UA: NEGATIVE
Nitrite, UA: NEGATIVE
Protein, UA: NEGATIVE
RBC, UA: NEGATIVE
Specific Gravity, UA: 1.026 (ref 1.005–1.030)
UUROB: 0.2 mg/dL (ref 0.2–1.0)
pH, UA: 7 (ref 5.0–7.5)

## 2016-08-25 LAB — PMP SCREEN PROFILE (10S), URINE
Amphetamine Screen, Ur: NEGATIVE ng/mL
BARBITURATE SCRN UR: NEGATIVE ng/mL
BENZODIAZEPINE SCREEN, URINE: NEGATIVE ng/mL
CREATININE(CRT), U: 128.7 mg/dL (ref 20.0–300.0)
Cannabinoids Ur Ql Scn: POSITIVE ng/mL
Cocaine(Metab.)Screen, Urine: NEGATIVE ng/mL
METHADONE SCREEN, URINE: NEGATIVE ng/mL
OXYCODONE+OXYMORPHONE UR QL SCN: NEGATIVE ng/mL
Opiate Scrn, Ur: NEGATIVE ng/mL
PCP Scrn, Ur: NEGATIVE ng/mL
PH UR, DRUG SCRN: 6.9 (ref 4.5–8.9)
Propoxyphene, Screen: NEGATIVE ng/mL

## 2016-08-25 LAB — ANTIBODY SCREEN: Antibody Screen: NEGATIVE

## 2016-08-25 LAB — CBC
Hematocrit: 35.5 % (ref 34.0–46.6)
Hemoglobin: 11.6 g/dL (ref 11.1–15.9)
MCH: 26.7 pg (ref 26.6–33.0)
MCHC: 32.7 g/dL (ref 31.5–35.7)
MCV: 82 fL (ref 79–97)
PLATELETS: 304 10*3/uL (ref 150–379)
RBC: 4.34 x10E6/uL (ref 3.77–5.28)
RDW: 13.9 % (ref 12.3–15.4)
WBC: 6.5 10*3/uL (ref 3.4–10.8)

## 2016-08-25 LAB — HEPATITIS B SURFACE ANTIGEN: Hepatitis B Surface Ag: NEGATIVE

## 2016-08-25 LAB — RPR: RPR: NONREACTIVE

## 2016-08-25 LAB — HIV ANTIBODY (ROUTINE TESTING W REFLEX): HIV SCREEN 4TH GENERATION: NONREACTIVE

## 2016-08-25 LAB — GC/CHLAMYDIA PROBE AMP
Chlamydia trachomatis, NAA: NEGATIVE
NEISSERIA GONORRHOEAE BY PCR: NEGATIVE

## 2016-08-26 LAB — URINE CULTURE: Organism ID, Bacteria: NO GROWTH

## 2016-09-04 NOTE — L&D Delivery Note (Signed)
Delivery Note At 1:58 PM a viable female was delivered via Vaginal, Spontaneous Delivery (Presentation: ROA).  APGAR: 7, 9; weight pending.   Placenta status: Spontaneous, intact.  Cord: 3 vessels with the following complications: nuchal x1, delivered through and reduced after delivery.  Anesthesia:  epidural Episiotomy: None Lacerations: None Suture Repair: n/a Est. Blood Loss (mL): 50  Mom to postpartum.  Baby to Couplet care / Skin to Skin.  Barbara Mclean is a 21 y.o. G2P1001 at 10123w2d who was admitted for SROM. She was given cytotec x1 and pitocin for augmentation and delivered vaginally with a nuchal x1 over an intact perineum.   Barbara Mclean SNM 01/27/2017, 2:14 PM

## 2016-09-21 ENCOUNTER — Encounter: Payer: Medicaid Other | Admitting: Women's Health

## 2016-09-21 ENCOUNTER — Other Ambulatory Visit: Payer: Medicaid Other

## 2016-09-25 ENCOUNTER — Other Ambulatory Visit: Payer: Medicaid Other

## 2016-09-25 ENCOUNTER — Encounter: Payer: Medicaid Other | Admitting: Women's Health

## 2016-10-02 ENCOUNTER — Ambulatory Visit (INDEPENDENT_AMBULATORY_CARE_PROVIDER_SITE_OTHER): Payer: Medicaid Other | Admitting: Women's Health

## 2016-10-02 ENCOUNTER — Encounter: Payer: Self-pay | Admitting: Women's Health

## 2016-10-02 ENCOUNTER — Ambulatory Visit (INDEPENDENT_AMBULATORY_CARE_PROVIDER_SITE_OTHER): Payer: Medicaid Other

## 2016-10-02 VITALS — BP 94/70 | HR 80 | Wt 128.0 lb

## 2016-10-02 DIAGNOSIS — O23592 Infection of other part of genital tract in pregnancy, second trimester: Secondary | ICD-10-CM

## 2016-10-02 DIAGNOSIS — Z363 Encounter for antenatal screening for malformations: Secondary | ICD-10-CM

## 2016-10-02 DIAGNOSIS — F1290 Cannabis use, unspecified, uncomplicated: Secondary | ICD-10-CM

## 2016-10-02 DIAGNOSIS — Z3482 Encounter for supervision of other normal pregnancy, second trimester: Secondary | ICD-10-CM

## 2016-10-02 DIAGNOSIS — O283 Abnormal ultrasonic finding on antenatal screening of mother: Secondary | ICD-10-CM

## 2016-10-02 DIAGNOSIS — Z3682 Encounter for antenatal screening for nuchal translucency: Secondary | ICD-10-CM

## 2016-10-02 DIAGNOSIS — A599 Trichomoniasis, unspecified: Secondary | ICD-10-CM

## 2016-10-02 DIAGNOSIS — Z3A21 21 weeks gestation of pregnancy: Secondary | ICD-10-CM

## 2016-10-02 DIAGNOSIS — Z3490 Encounter for supervision of normal pregnancy, unspecified, unspecified trimester: Secondary | ICD-10-CM | POA: Insufficient documentation

## 2016-10-02 DIAGNOSIS — Z331 Pregnant state, incidental: Secondary | ICD-10-CM

## 2016-10-02 DIAGNOSIS — Z1389 Encounter for screening for other disorder: Secondary | ICD-10-CM

## 2016-10-02 DIAGNOSIS — O99322 Drug use complicating pregnancy, second trimester: Secondary | ICD-10-CM

## 2016-10-02 DIAGNOSIS — Z8619 Personal history of other infectious and parasitic diseases: Secondary | ICD-10-CM

## 2016-10-02 LAB — POCT URINALYSIS DIPSTICK
Glucose, UA: NEGATIVE
Ketones, UA: NEGATIVE
Leukocytes, UA: NEGATIVE
Nitrite, UA: NEGATIVE
RBC UA: NEGATIVE

## 2016-10-02 LAB — POCT WET PREP (WET MOUNT)
Clue Cells Wet Prep Whiff POC: POSITIVE
Trichomonas Wet Prep HPF POC: ABSENT

## 2016-10-02 MED ORDER — METRONIDAZOLE 500 MG PO TABS: 500.0000 mg | ORAL_TABLET | Freq: Two times a day (BID) | ORAL | 0 refills | Status: DC

## 2016-10-02 MED ORDER — DOXYLAMINE-PYRIDOXINE 10-10 MG PO TBEC: DELAYED_RELEASE_TABLET | ORAL | 6 refills | Status: DC

## 2016-10-02 NOTE — Progress Notes (Signed)
US 20+4 wks,cephalic,fundal pl gr 0,normal ov's bilat,cx 3.5 cm,svp of fluid 5.6 cm,fhr 140 bpm,efw 318 g, small fetal stomach,anatomy complete

## 2016-10-02 NOTE — Patient Instructions (Signed)
Second Trimester of Pregnancy The second trimester is from week 13 through week 28 (months 4 through 6). The second trimester is often a time when you feel your best. Your body has also adjusted to being pregnant, and you begin to feel better physically. Usually, morning sickness has lessened or quit completely, you may have more energy, and you may have an increase in appetite. The second trimester is also a time when the fetus is growing rapidly. At the end of the sixth month, the fetus is about 9 inches long and weighs about 1 pounds. You will likely begin to feel the baby move (quickening) between 18 and 20 weeks of the pregnancy. Body changes during your second trimester Your body continues to go through many changes during your second trimester. The changes vary from woman to woman.  Your weight will continue to increase. You will notice your lower abdomen bulging out.  You may begin to get stretch marks on your hips, abdomen, and breasts.  You may develop headaches that can be relieved by medicines. The medicines should be approved by your health care provider.  You may urinate more often because the fetus is pressing on your bladder.  You may develop or continue to have heartburn as a result of your pregnancy.  You may develop constipation because certain hormones are causing the muscles that push waste through your intestines to slow down.  You may develop hemorrhoids or swollen, bulging veins (varicose veins).  You may have back pain. This is caused by:  Weight gain.  Pregnancy hormones that are relaxing the joints in your pelvis.  A shift in weight and the muscles that support your balance.  Your breasts will continue to grow and they will continue to become tender.  Your gums may bleed and may be sensitive to brushing and flossing.  Dark spots or blotches (chloasma, mask of pregnancy) may develop on your face. This will likely fade after the baby is born.  A dark line  from your belly button to the pubic area (linea nigra) may appear. This will likely fade after the baby is born.  You may have changes in your hair. These can include thickening of your hair, rapid growth, and changes in texture. Some women also have hair loss during or after pregnancy, or hair that feels dry or thin. Your hair will most likely return to normal after your baby is born. What to expect at prenatal visits During a routine prenatal visit:  You will be weighed to make sure you and the fetus are growing normally.  Your blood pressure will be taken.  Your abdomen will be measured to track your baby's growth.  The fetal heartbeat will be listened to.  Any test results from the previous visit will be discussed. Your health care provider may ask you:  How you are feeling.  If you are feeling the baby move.  If you have had any abnormal symptoms, such as leaking fluid, bleeding, severe headaches, or abdominal cramping.  If you are using any tobacco products, including cigarettes, chewing tobacco, and electronic cigarettes.  If you have any questions. Other tests that may be performed during your second trimester include:  Blood tests that check for:  Low iron levels (anemia).  Gestational diabetes (between 24 and 28 weeks).  Rh antibodies. This is to check for a protein on red blood cells (Rh factor).  Urine tests to check for infections, diabetes, or protein in the urine.  An ultrasound to   confirm the proper growth and development of the baby.  An amniocentesis to check for possible genetic problems.  Fetal screens for spina bifida and Down syndrome.  HIV (human immunodeficiency virus) testing. Routine prenatal testing includes screening for HIV, unless you choose not to have this test. Follow these instructions at home: Eating and drinking  Continue to eat regular, healthy meals.  Avoid raw meat, uncooked cheese, cat litter boxes, and soil used by cats. These  carry germs that can cause birth defects in the baby.  Take your prenatal vitamins.  Take 1500-2000 mg of calcium daily starting at the 20th week of pregnancy until you deliver your baby.  If you develop constipation:  Take over-the-counter or prescription medicines.  Drink enough fluid to keep your urine clear or pale yellow.  Eat foods that are high in fiber, such as fresh fruits and vegetables, whole grains, and beans.  Limit foods that are high in fat and processed sugars, such as fried and sweet foods. Activity  Exercise only as directed by your health care provider. Experiencing uterine cramps is a good sign to stop exercising.  Avoid heavy lifting, wear low heel shoes, and practice good posture.  Wear your seat belt at all times when driving.  Rest with your legs elevated if you have leg cramps or low back pain.  Wear a good support bra for breast tenderness.  Do not use hot tubs, steam rooms, or saunas. Lifestyle  Avoid all smoking, herbs, alcohol, and unprescribed drugs. These chemicals affect the formation and growth of the baby.  Do not use any products that contain nicotine or tobacco, such as cigarettes and e-cigarettes. If you need help quitting, ask your health care provider.  A sexual relationship may be continued unless your health care provider directs you otherwise. General instructions  Follow your health care provider's instructions regarding medicine use. There are medicines that are either safe or unsafe to take during pregnancy.  Take warm sitz baths to soothe any pain or discomfort caused by hemorrhoids. Use hemorrhoid cream if your health care provider approves.  If you develop varicose veins, wear support hose. Elevate your feet for 15 minutes, 3-4 times a day. Limit salt in your diet.  Visit your dentist if you have not gone yet during your pregnancy. Use a soft toothbrush to brush your teeth and be gentle when you floss.  Keep all follow-up  prenatal visits as told by your health care provider. This is important. Contact a health care provider if:  You have dizziness.  You have mild pelvic cramps, pelvic pressure, or nagging pain in the abdominal area.  You have persistent nausea, vomiting, or diarrhea.  You have a bad smelling vaginal discharge.  You have pain with urination. Get help right away if:  You have a fever.  You are leaking fluid from your vagina.  You have spotting or bleeding from your vagina.  You have severe abdominal cramping or pain.  You have rapid weight gain or weight loss.  You have shortness of breath with chest pain.  You notice sudden or extreme swelling of your face, hands, ankles, feet, or legs.  You have not felt your baby move in over an hour.  You have severe headaches that do not go away with medicine.  You have vision changes. Summary  The second trimester is from week 13 through week 28 (months 4 through 6). It is also a time when the fetus is growing rapidly.  Your body goes   through many changes during pregnancy. The changes vary from woman to woman.  Avoid all smoking, herbs, alcohol, and unprescribed drugs. These chemicals affect the formation and growth your baby.  Do not use any tobacco products, such as cigarettes, chewing tobacco, and e-cigarettes. If you need help quitting, ask your health care provider.  Contact your health care provider if you have any questions. Keep all prenatal visits as told by your health care provider. This is important. This information is not intended to replace advice given to you by your health care provider. Make sure you discuss any questions you have with your health care provider. Document Released: 08/15/2001 Document Revised: 01/27/2016 Document Reviewed: 10/22/2012 [employer] Interactive Patient Education  2017 [employer]  

## 2016-10-02 NOTE — Progress Notes (Signed)
Low-risk OB appointment G2P1001 2233w4d Estimated Date of Delivery: 02/15/17 BP 94/70   Pulse 80   Wt 128 lb (58.1 kg)   LMP 05/19/2016 (LMP Unknown)   BMI 21.30 kg/m   BP, weight, and urine reviewed.  Refer to obstetrical flow sheet for FH & FHR.  Reports good fm.  Denies regular uc's, lof, vb, or uti s/s. No complaints. Last THC 2wks ago- will recheck at next visit. Advised no further thc during pregnancy.  Never had neg trich POC from last pregnancy Spec exam: cx closed, mod amt thin white malodorous frothy d/c, wet prep neg trich, many clues, rx metronidazole bid x 7d, no sex while taking  Reviewed today's anatomy u/s, normal female w/ small stomach- will recheck at next visit. Discussed warning s/s to report, fm Plan:  Continue routine obstetrical care  F/U in 4wks for OB appointment and f/u u/s small fetal stomach 2nd IT today

## 2016-10-08 LAB — MATERNAL SCREEN, INTEGRATED #2
ADSF: 0.98
AFP MoM: 1.95
Alpha-Fetoprotein: 133.1 ng/mL
Crown Rump Length: 76.7 mm
DIA MoM: 0.49
DIA VALUE: 118.3 pg/mL
ESTRIOL UNCONJUGATED: 2.04 ng/mL
Gest. Age on Collection Date: 13.3 weeks
Gestational Age: 20.1 weeks
HCG MOM: 0.86
MATERNAL AGE AT EDD: 23.3 a
NUMBER OF FETUSES: 1
Nuchal Translucency (NT): 2 mm
Nuchal Translucency MoM: 1.07
PAPP-A MoM: 0.21
PAPP-A Value: 344.3 ng/mL
Test Results:: NEGATIVE
WEIGHT: 119 [lb_av]
Weight: 119 [lb_av]
hCG Value: 20.6 IU/mL

## 2016-10-30 ENCOUNTER — Ambulatory Visit (INDEPENDENT_AMBULATORY_CARE_PROVIDER_SITE_OTHER): Payer: Medicaid Other

## 2016-10-30 ENCOUNTER — Ambulatory Visit (INDEPENDENT_AMBULATORY_CARE_PROVIDER_SITE_OTHER): Payer: Medicaid Other | Admitting: Obstetrics & Gynecology

## 2016-10-30 ENCOUNTER — Encounter: Payer: Self-pay | Admitting: Obstetrics & Gynecology

## 2016-10-30 VITALS — BP 100/60 | HR 78 | Wt 135.0 lb

## 2016-10-30 DIAGNOSIS — Z1389 Encounter for screening for other disorder: Secondary | ICD-10-CM

## 2016-10-30 DIAGNOSIS — Z3A25 25 weeks gestation of pregnancy: Secondary | ICD-10-CM

## 2016-10-30 DIAGNOSIS — Z3482 Encounter for supervision of other normal pregnancy, second trimester: Secondary | ICD-10-CM

## 2016-10-30 DIAGNOSIS — O283 Abnormal ultrasonic finding on antenatal screening of mother: Secondary | ICD-10-CM

## 2016-10-30 DIAGNOSIS — Z331 Pregnant state, incidental: Secondary | ICD-10-CM

## 2016-10-30 LAB — POCT URINALYSIS DIPSTICK
Blood, UA: NEGATIVE
Glucose, UA: NEGATIVE
Ketones, UA: NEGATIVE
LEUKOCYTES UA: NEGATIVE
NITRITE UA: NEGATIVE

## 2016-10-30 NOTE — Progress Notes (Signed)
US 24+4 wks,cephalic,cx 4 cm,post fundal pl gr 1,normal ov's bilat,svp of fluid 4.9cm,fhr 132 bpm,stomach was fully distended on today's ultrasound, no obvious abnormalities seen,EFW 737 g 51%

## 2016-10-30 NOTE — Progress Notes (Signed)
G2P1001 3521w4d Estimated Date of Delivery: 02/15/17  Blood pressure 100/60, pulse 78, weight 135 lb (61.2 kg), last menstrual period 05/19/2016, not currently breastfeeding.   BP weight and urine results all reviewed and noted.  Please refer to the obstetrical flow sheet for the fundal height and fetal heart rate documentation:  Patient reports good fetal movement, denies any bleeding and no rupture of membranes symptoms or regular contractions. Patient is without complaints. All questions were answered.  Orders Placed This Encounter  Procedures  . POCT urinalysis dipstick    Plan:  Continued routine obstetrical care, sonogram is normal  Return in about 3 weeks (around 11/20/2016) for PN2, , LROB.

## 2016-11-21 ENCOUNTER — Encounter: Payer: Medicaid Other | Admitting: Women's Health

## 2016-11-21 ENCOUNTER — Other Ambulatory Visit: Payer: Medicaid Other

## 2016-11-29 ENCOUNTER — Encounter: Payer: Self-pay | Admitting: Obstetrics and Gynecology

## 2016-11-29 ENCOUNTER — Other Ambulatory Visit: Payer: Medicaid Other

## 2016-11-29 ENCOUNTER — Ambulatory Visit (INDEPENDENT_AMBULATORY_CARE_PROVIDER_SITE_OTHER): Payer: Medicaid Other | Admitting: Obstetrics and Gynecology

## 2016-11-29 VITALS — BP 100/60 | HR 78 | Wt 140.0 lb

## 2016-11-29 DIAGNOSIS — Z3A28 28 weeks gestation of pregnancy: Secondary | ICD-10-CM

## 2016-11-29 DIAGNOSIS — Z1389 Encounter for screening for other disorder: Secondary | ICD-10-CM

## 2016-11-29 DIAGNOSIS — Z3483 Encounter for supervision of other normal pregnancy, third trimester: Secondary | ICD-10-CM

## 2016-11-29 DIAGNOSIS — Z331 Pregnant state, incidental: Secondary | ICD-10-CM

## 2016-11-29 DIAGNOSIS — Z131 Encounter for screening for diabetes mellitus: Secondary | ICD-10-CM

## 2016-11-29 DIAGNOSIS — Z3482 Encounter for supervision of other normal pregnancy, second trimester: Secondary | ICD-10-CM

## 2016-11-29 LAB — POCT URINALYSIS DIPSTICK
GLUCOSE UA: NEGATIVE
Ketones, UA: NEGATIVE
Leukocytes, UA: NEGATIVE
NITRITE UA: NEGATIVE
RBC UA: NEGATIVE

## 2016-11-29 NOTE — Progress Notes (Signed)
Patient ID: Barbara Mclean, female   DOB: 01-13-1996, 21 y.o.   MRN: [id number] G2P1001 3882w6d Estimated Date of Delivery: 02/15/17 LROB  Patient reports good fetal movement, denies any bleeding and no rupture of membranes symptoms or regular contractions. Patient complaints: no complaints at this time. Pt notes that she attempted to breastfeed her last baby, but the pt was unable to latch. Pt states that she is currently researching contraception measures.   Blood pressure 100/60, pulse 78, weight 140 lb (63.5 kg), last menstrual period 05/19/2016, not currently breastfeeding. refer to the ob flow sheet for FH and FHR, also BP, Wt, Urine results:notable for trace protein, otherwise negative                          Physical Examination:  General appearance - alert, well appearing, and in no distress Abdomen - FH 26 cm                   -FHR 126 soft, nontender, nondistended, no masses or organomegaly                          Questions were answered. Assessment: LROB G2P1001 @ 1082w6d  Plan:  Continued routine obstetrical care  F/u in 4 weeks for routine OB  By signing my name below, I, Soijett Blue, attest that this documentation has been prepared under the direction and in the presence of Tilda BurrowJohn Solon Alban V, MD. Electronically Signed: Soijett Blue, ED Scribe. 11/29/16. 10:45 AM.  I personally performed the services described in this documentation, which was SCRIBED in my presence. The recorded information has been reviewed and considered accurate. It has been edited as necessary during review. Tilda BurrowFERGUSON,Dexton Zwilling V, MD

## 2016-11-30 LAB — CBC
Hematocrit: 31 % — ABNORMAL LOW (ref 34.0–46.6)
Hemoglobin: 10.4 g/dL — ABNORMAL LOW (ref 11.1–15.9)
MCH: 27.4 pg (ref 26.6–33.0)
MCHC: 33.5 g/dL (ref 31.5–35.7)
MCV: 82 fL (ref 79–97)
PLATELETS: 283 10*3/uL (ref 150–379)
RBC: 3.8 x10E6/uL (ref 3.77–5.28)
RDW: 13.8 % (ref 12.3–15.4)
WBC: 6.6 10*3/uL (ref 3.4–10.8)

## 2016-11-30 LAB — HIV ANTIBODY (ROUTINE TESTING W REFLEX): HIV Screen 4th Generation wRfx: NONREACTIVE

## 2016-11-30 LAB — GLUCOSE TOLERANCE, 2 HOURS W/ 1HR
Glucose, 1 hour: 150 mg/dL (ref 65–179)
Glucose, 2 hour: 108 mg/dL (ref 65–152)
Glucose, Fasting: 72 mg/dL (ref 65–91)

## 2016-11-30 LAB — ANTIBODY SCREEN: Antibody Screen: NEGATIVE

## 2016-11-30 LAB — RPR: RPR: NONREACTIVE

## 2016-12-27 ENCOUNTER — Ambulatory Visit (INDEPENDENT_AMBULATORY_CARE_PROVIDER_SITE_OTHER): Payer: Medicaid Other | Admitting: Advanced Practice Midwife

## 2016-12-27 ENCOUNTER — Encounter: Payer: Self-pay | Admitting: Advanced Practice Midwife

## 2016-12-27 VITALS — BP 110/62 | HR 80 | Wt 144.0 lb

## 2016-12-27 DIAGNOSIS — Z1389 Encounter for screening for other disorder: Secondary | ICD-10-CM

## 2016-12-27 DIAGNOSIS — Z3483 Encounter for supervision of other normal pregnancy, third trimester: Secondary | ICD-10-CM

## 2016-12-27 DIAGNOSIS — Z331 Pregnant state, incidental: Secondary | ICD-10-CM

## 2016-12-27 LAB — POCT URINALYSIS DIPSTICK
Blood, UA: NEGATIVE
Glucose, UA: NEGATIVE
Ketones, UA: NEGATIVE
Leukocytes, UA: NEGATIVE
Nitrite, UA: NEGATIVE
Protein, UA: NEGATIVE

## 2016-12-27 NOTE — Progress Notes (Signed)
G2P1001 [redacted]w[redacted]d Estimated Date of Delivery: 02/15/17  Blood pressure 110/62, pulse 80, weight 144 lb (65.3 kg), last menstrual period 05/19/2016, not currently breastfeeding.   BP weight and urine results all reviewed and noted.  Please refer to the obstetrical flow sheet for the fundal height and fetal heart rate documentation:  Patient reports good fetal movement, denies any bleeding and no rupture of membranes symptoms or regular contractions. Patient is without complaints. All questions were answered.  Orders Placed This Encounter  Procedures  . POCT urinalysis dipstick    Plan:  Continued routine obstetrical care,  Return in about 2 weeks (around 01/10/2017) for LROB.

## 2017-01-10 ENCOUNTER — Ambulatory Visit (INDEPENDENT_AMBULATORY_CARE_PROVIDER_SITE_OTHER): Payer: Medicaid Other | Admitting: Advanced Practice Midwife

## 2017-01-10 ENCOUNTER — Encounter: Payer: Self-pay | Admitting: Advanced Practice Midwife

## 2017-01-10 VITALS — BP 108/70 | HR 85 | Wt 145.0 lb

## 2017-01-10 DIAGNOSIS — Z331 Pregnant state, incidental: Secondary | ICD-10-CM

## 2017-01-10 DIAGNOSIS — Z1389 Encounter for screening for other disorder: Secondary | ICD-10-CM

## 2017-01-10 DIAGNOSIS — Z3483 Encounter for supervision of other normal pregnancy, third trimester: Secondary | ICD-10-CM

## 2017-01-10 LAB — POCT URINALYSIS DIPSTICK
Blood, UA: NEGATIVE
GLUCOSE UA: NEGATIVE
Ketones, UA: NEGATIVE
Nitrite, UA: NEGATIVE
PROTEIN UA: NEGATIVE

## 2017-01-10 NOTE — Patient Instructions (Signed)

## 2017-01-10 NOTE — Progress Notes (Signed)
G2P1001 1426w6d Estimated Date of Delivery: 02/15/17  Blood pressure 108/70, pulse 85, weight 145 lb (65.8 kg), last menstrual period 05/19/2016, not currently breastfeeding.   BP weight and urine results all reviewed and noted.  Please refer to the obstetrical flow sheet for the fundal height and fetal heart rate documentation:  Patient reports good fetal movement, denies any bleeding and no rupture of membranes symptoms or regular contractions. Patient is without complaints. All questions were answered.  No orders of the defined types were placed in this encounter.   Plan:  Continued routine obstetrical care,   Return in about 2 weeks (around 01/24/2017) for LROB.

## 2017-01-10 NOTE — Progress Notes (Signed)
G2P1001 2051w6d Estimated Date of Delivery: 02/15/17  Blood pressure 108/70, pulse 85, weight 145 lb (65.8 kg), last menstrual period 05/19/2016, not currently breastfeeding.   BP weight and urine results all reviewed and noted.  Please refer to the obstetrical flow sheet for the fundal height and fetal heart rate documentation:  Patient reports good fetal movement, denies any bleeding and no rupture of membranes symptoms or regular contractions. Patient is without complaints. All questions were answered.  No orders of the defined types were placed in this encounter.   Plan:  Continued routine obstetrical care.  Return in about 2 weeks (around 01/24/2017) for LROB.   Jonetta Speakarol Chaney Ingram

## 2017-01-19 ENCOUNTER — Telehealth: Payer: Self-pay | Admitting: *Deleted

## 2017-01-19 NOTE — Telephone Encounter (Signed)
Patient states she has been having severe back discomfort for a few days now. She has tried changing positions but no relief. Informed patient that the further along in pregnancy she gets the more her center of gravity is affected and with that can come back pain. Advised to try sleeping with a pillow under her belly and between her legs and to maybe try a pregnancy cradle for support. Also encouraged trying a yoga ball for hip movement exercises and stretches. She states the vaginal tenderness is better now, she has not switched soaps. Encouraged cool compresses to the area for comfort. Verbalized understanding.

## 2017-01-24 ENCOUNTER — Ambulatory Visit (INDEPENDENT_AMBULATORY_CARE_PROVIDER_SITE_OTHER): Payer: Medicaid Other | Admitting: Obstetrics and Gynecology

## 2017-01-24 ENCOUNTER — Encounter: Payer: Self-pay | Admitting: Obstetrics and Gynecology

## 2017-01-24 VITALS — HR 82 | Wt 145.0 lb

## 2017-01-24 DIAGNOSIS — Z1389 Encounter for screening for other disorder: Secondary | ICD-10-CM

## 2017-01-24 DIAGNOSIS — Z3483 Encounter for supervision of other normal pregnancy, third trimester: Secondary | ICD-10-CM | POA: Diagnosis not present

## 2017-01-24 DIAGNOSIS — Z3A36 36 weeks gestation of pregnancy: Secondary | ICD-10-CM

## 2017-01-24 DIAGNOSIS — Z331 Pregnant state, incidental: Secondary | ICD-10-CM | POA: Diagnosis not present

## 2017-01-24 LAB — POCT URINALYSIS DIPSTICK
Blood, UA: NEGATIVE
GLUCOSE UA: NEGATIVE
Ketones, UA: NEGATIVE
NITRITE UA: NEGATIVE
Protein, UA: NEGATIVE

## 2017-01-24 NOTE — Progress Notes (Signed)
Patient ID: Barbara Mclean, female   DOB: 05/11/1996, 21 y.o.   MRN: [id number]  [id number]  [facility]  Chief Complaint  Patient presents with  . Routine Prenatal Visit  ____  Patient complaints: She complains of some mild labial irritation/burning/itching. She denies changes in vaginal discharge. She is planning on IUD after delivery.   Patient reports good fetal movement. She denies any bleeding, rupture of membranes,or regular contractions.  Last menstrual period 05/19/2016, not currently breastfeeding.   Urine results:notable for +1 leukocytes  refer to the ob flow sheet for FH and FHR, ,                          Physical Examination: General appearance - alert, well appearing, and in no distress                                      Abdomen - FH 36 cm                                                        -FHR 122 bpm                                                         soft, nontender, nondistended, no masses or organomegaly                                      Pelvic - VULVA: normal appearing vulva with no masses, tenderness or lesions, VAGINA: normal appearing vagina with normal color and discharge, no lesions, GBS and GC/CHL collected CERVIX: closed, long, posterior, fetus vertex                                             Questions were answered. Assessment: LROB G2P1001 @ 438w6d  Chronic moisture changes   Plan:  Continued routine obstetrical care, recommended OTC hydrocortisone cream.   F/u in 1 week for routine prenatal care   By signing my name below, I, Doreatha MartinEva Mathews, attest that this documentation has been prepared under the direction and in the presence of Tilda BurrowFerguson, Hinata Diener V, MD. Electronically Signed: Doreatha MartinEva Mathews, ED Scribe. 01/24/17. 11:42 AM.  I personally performed the services described in this documentation, which was SCRIBED in my presence. The recorded information has been reviewed and considered accurate. It has been edited as  necessary during review. Tilda BurrowFERGUSON,Tylyn Derwin V, MD

## 2017-01-26 LAB — GC/CHLAMYDIA PROBE AMP
Chlamydia trachomatis, NAA: NEGATIVE
NEISSERIA GONORRHOEAE BY PCR: NEGATIVE

## 2017-01-27 ENCOUNTER — Inpatient Hospital Stay (HOSPITAL_COMMUNITY): Payer: Medicaid Other | Admitting: Anesthesiology

## 2017-01-27 ENCOUNTER — Inpatient Hospital Stay (HOSPITAL_COMMUNITY)
Admission: AD | Admit: 2017-01-27 | Discharge: 2017-01-29 | DRG: 775 | Disposition: A | Discharge status: Home / Self Care | Payer: Medicaid Other | Source: Ambulatory Visit | Attending: Obstetrics & Gynecology | Admitting: Obstetrics & Gynecology

## 2017-01-27 ENCOUNTER — Encounter (HOSPITAL_COMMUNITY): Payer: Self-pay

## 2017-01-27 DIAGNOSIS — Z87891 Personal history of nicotine dependence: Secondary | ICD-10-CM

## 2017-01-27 DIAGNOSIS — Z3A37 37 weeks gestation of pregnancy: Secondary | ICD-10-CM

## 2017-01-27 DIAGNOSIS — O4202 Full-term premature rupture of membranes, onset of labor within 24 hours of rupture: Secondary | ICD-10-CM | POA: Diagnosis not present

## 2017-01-27 DIAGNOSIS — Z833 Family history of diabetes mellitus: Secondary | ICD-10-CM | POA: Diagnosis not present

## 2017-01-27 DIAGNOSIS — O4292 Full-term premature rupture of membranes, unspecified as to length of time between rupture and onset of labor: Secondary | ICD-10-CM | POA: Diagnosis present

## 2017-01-27 DIAGNOSIS — Z8249 Family history of ischemic heart disease and other diseases of the circulatory system: Secondary | ICD-10-CM | POA: Diagnosis not present

## 2017-01-27 DIAGNOSIS — O4290 Premature rupture of membranes, unspecified as to length of time between rupture and onset of labor, unspecified weeks of gestation: Secondary | ICD-10-CM | POA: Diagnosis present

## 2017-01-27 LAB — CBC
HEMATOCRIT: 30.9 % — AB (ref 36.0–46.0)
Hemoglobin: 10.3 g/dL — ABNORMAL LOW (ref 12.0–15.0)
MCH: 27.3 pg (ref 26.0–34.0)
MCHC: 33.3 g/dL (ref 30.0–36.0)
MCV: 82 fL (ref 78.0–100.0)
Platelets: 267 10*3/uL (ref 150–400)
RBC: 3.77 MIL/uL — AB (ref 3.87–5.11)
RDW: 14.3 % (ref 11.5–15.5)
WBC: 7.2 10*3/uL (ref 4.0–10.5)

## 2017-01-27 LAB — GROUP B STREP BY PCR: GROUP B STREP BY PCR: NEGATIVE

## 2017-01-27 LAB — TYPE AND SCREEN
ABO/RH(D): O POS
ANTIBODY SCREEN: NEGATIVE

## 2017-01-27 LAB — POCT FERN TEST

## 2017-01-27 LAB — RPR: RPR Ser Ql: NONREACTIVE

## 2017-01-27 MED ORDER — OXYTOCIN 40 UNITS IN LACTATED RINGERS INFUSION - SIMPLE MED
2.5000 [IU]/h | INTRAVENOUS | Status: DC
  Filled 2017-01-27: qty 1000

## 2017-01-27 MED ORDER — LACTATED RINGERS IV SOLN
INTRAVENOUS | Status: DC
  Administered 2017-01-27 (×2): via INTRAVENOUS

## 2017-01-27 MED ORDER — ONDANSETRON HCL 4 MG/2ML IJ SOLN: 4.0000 mg | INTRAMUSCULAR | Status: DC | PRN

## 2017-01-27 MED ORDER — ACETAMINOPHEN 325 MG PO TABS
650.0000 mg | ORAL_TABLET | ORAL | Status: DC | PRN
  Administered 2017-01-28 (×2): 650 mg via ORAL
  Filled 2017-01-27 (×2): qty 2

## 2017-01-27 MED ORDER — OXYCODONE-ACETAMINOPHEN 5-325 MG PO TABS: 1.0000 | ORAL_TABLET | ORAL | Status: DC | PRN

## 2017-01-27 MED ORDER — TETANUS-DIPHTH-ACELL PERTUSSIS 5-2.5-18.5 LF-MCG/0.5 IM SUSP
0.5000 mL | Freq: Once | INTRAMUSCULAR | Status: AC
  Administered 2017-01-29: 0.5 mL via INTRAMUSCULAR
  Filled 2017-01-27: qty 0.5

## 2017-01-27 MED ORDER — METHYLERGONOVINE MALEATE 0.2 MG PO TABS: 0.2000 mg | ORAL_TABLET | ORAL | Status: DC | PRN

## 2017-01-27 MED ORDER — MEASLES, MUMPS & RUBELLA VAC ~~LOC~~ INJ
0.5000 mL | INJECTION | Freq: Once | SUBCUTANEOUS | Status: DC
  Filled 2017-01-27: qty 0.5

## 2017-01-27 MED ORDER — TERBUTALINE SULFATE 1 MG/ML IJ SOLN
0.2500 mg | Freq: Once | INTRAMUSCULAR | Status: DC | PRN
  Filled 2017-01-27: qty 1

## 2017-01-27 MED ORDER — LIDOCAINE HCL (PF) 1 % IJ SOLN
INTRAMUSCULAR | Status: DC | PRN
  Administered 2017-01-27 (×2): 7 mL via EPIDURAL

## 2017-01-27 MED ORDER — SIMETHICONE 80 MG PO CHEW: 80.0000 mg | CHEWABLE_TABLET | ORAL | Status: DC | PRN

## 2017-01-27 MED ORDER — PHENYLEPHRINE 40 MCG/ML (10ML) SYRINGE FOR IV PUSH (FOR BLOOD PRESSURE SUPPORT)
80.0000 ug | PREFILLED_SYRINGE | INTRAVENOUS | Status: DC | PRN
  Filled 2017-01-27: qty 5
  Filled 2017-01-27: qty 10

## 2017-01-27 MED ORDER — OXYCODONE HCL 5 MG PO TABS
5.0000 mg | ORAL_TABLET | ORAL | Status: DC | PRN
  Administered 2017-01-28: 5 mg via ORAL
  Filled 2017-01-27: qty 1

## 2017-01-27 MED ORDER — ACETAMINOPHEN 325 MG PO TABS: 650.0000 mg | ORAL_TABLET | ORAL | Status: DC | PRN

## 2017-01-27 MED ORDER — OXYTOCIN BOLUS FROM INFUSION
500.0000 mL | Freq: Once | INTRAVENOUS | Status: AC
  Administered 2017-01-27: 500 mL via INTRAVENOUS

## 2017-01-27 MED ORDER — EPHEDRINE 5 MG/ML INJ
10.0000 mg | INTRAVENOUS | Status: DC | PRN
  Filled 2017-01-27: qty 2

## 2017-01-27 MED ORDER — PRENATAL MULTIVITAMIN CH
1.0000 | ORAL_TABLET | Freq: Every day | ORAL | Status: DC
  Administered 2017-01-28 – 2017-01-29 (×2): 1 via ORAL
  Filled 2017-01-27 (×2): qty 1

## 2017-01-27 MED ORDER — SOD CITRATE-CITRIC ACID 500-334 MG/5ML PO SOLN: 30.0000 mL | ORAL | Status: DC | PRN

## 2017-01-27 MED ORDER — DIPHENHYDRAMINE HCL 25 MG PO CAPS: 25.0000 mg | ORAL_CAPSULE | Freq: Four times a day (QID) | ORAL | Status: DC | PRN

## 2017-01-27 MED ORDER — PHENYLEPHRINE 40 MCG/ML (10ML) SYRINGE FOR IV PUSH (FOR BLOOD PRESSURE SUPPORT)
80.0000 ug | PREFILLED_SYRINGE | INTRAVENOUS | Status: DC | PRN
  Filled 2017-01-27: qty 5

## 2017-01-27 MED ORDER — OXYTOCIN 40 UNITS IN LACTATED RINGERS INFUSION - SIMPLE MED
1.0000 m[IU]/min | INTRAVENOUS | Status: DC
  Administered 2017-01-27: 2 m[IU]/min via INTRAVENOUS

## 2017-01-27 MED ORDER — MISOPROSTOL 50MCG HALF TABLET
50.0000 ug | ORAL_TABLET | Freq: Once | ORAL | Status: AC
  Administered 2017-01-27: 50 ug via ORAL
  Filled 2017-01-27: qty 1

## 2017-01-27 MED ORDER — LACTATED RINGERS IV SOLN
500.0000 mL | Freq: Once | INTRAVENOUS | Status: AC
  Administered 2017-01-27: 500 mL via INTRAVENOUS

## 2017-01-27 MED ORDER — OXYCODONE-ACETAMINOPHEN 5-325 MG PO TABS: 2.0000 | ORAL_TABLET | ORAL | Status: DC | PRN

## 2017-01-27 MED ORDER — SENNOSIDES-DOCUSATE SODIUM 8.6-50 MG PO TABS
2.0000 | ORAL_TABLET | ORAL | Status: DC
  Administered 2017-01-27 – 2017-01-29 (×2): 2 via ORAL
  Filled 2017-01-27 (×2): qty 2

## 2017-01-27 MED ORDER — LIDOCAINE HCL (PF) 1 % IJ SOLN
30.0000 mL | INTRAMUSCULAR | Status: DC | PRN
  Filled 2017-01-27: qty 30

## 2017-01-27 MED ORDER — TERBUTALINE SULFATE 1 MG/ML IJ SOLN: 0.2500 mg | Freq: Once | INTRAMUSCULAR | Status: DC

## 2017-01-27 MED ORDER — TERBUTALINE SULFATE 1 MG/ML IJ SOLN
INTRAMUSCULAR | Status: AC
  Administered 2017-01-27: 1 mg
  Filled 2017-01-27: qty 1

## 2017-01-27 MED ORDER — COCONUT OIL OIL
1.0000 | TOPICAL_OIL | Status: DC | PRN
Start: 2017-01-27 — End: 2017-01-29
  Administered 2017-01-27: 1 via TOPICAL
  Filled 2017-01-27: qty 120

## 2017-01-27 MED ORDER — FLEET ENEMA 7-19 GM/118ML RE ENEM: 1.0000 | ENEMA | RECTAL | Status: DC | PRN

## 2017-01-27 MED ORDER — MISOPROSTOL 200 MCG PO TABS: 50.0000 ug | ORAL_TABLET | Freq: Once | ORAL | Status: DC

## 2017-01-27 MED ORDER — IBUPROFEN 600 MG PO TABS
600.0000 mg | ORAL_TABLET | Freq: Four times a day (QID) | ORAL | Status: DC
  Administered 2017-01-27 – 2017-01-29 (×8): 600 mg via ORAL
  Filled 2017-01-27 (×8): qty 1

## 2017-01-27 MED ORDER — DIBUCAINE 1 % RE OINT
1.0000 "application " | TOPICAL_OINTMENT | RECTAL | Status: DC | PRN
  Administered 2017-01-27: 1 via RECTAL
  Filled 2017-01-27: qty 28

## 2017-01-27 MED ORDER — METHYLERGONOVINE MALEATE 0.2 MG/ML IJ SOLN: 0.2000 mg | INTRAMUSCULAR | Status: DC | PRN

## 2017-01-27 MED ORDER — FENTANYL 2.5 MCG/ML BUPIVACAINE 1/10 % EPIDURAL INFUSION (WH - ANES)
14.0000 mL/h | INTRAMUSCULAR | Status: DC | PRN
  Administered 2017-01-27: 14 mL/h via EPIDURAL
  Filled 2017-01-27: qty 100

## 2017-01-27 MED ORDER — WITCH HAZEL-GLYCERIN EX PADS
1.0000 "application " | MEDICATED_PAD | CUTANEOUS | Status: DC | PRN
  Administered 2017-01-27: 1 via TOPICAL

## 2017-01-27 MED ORDER — ZOLPIDEM TARTRATE 5 MG PO TABS: 5.0000 mg | ORAL_TABLET | Freq: Every evening | ORAL | Status: DC | PRN

## 2017-01-27 MED ORDER — LACTATED RINGERS IV SOLN
500.0000 mL | INTRAVENOUS | Status: DC | PRN
  Administered 2017-01-27 (×2): 500 mL via INTRAVENOUS

## 2017-01-27 MED ORDER — OXYCODONE HCL 5 MG PO TABS: 10.0000 mg | ORAL_TABLET | ORAL | Status: DC | PRN

## 2017-01-27 MED ORDER — ONDANSETRON HCL 4 MG PO TABS: 4.0000 mg | ORAL_TABLET | ORAL | Status: DC | PRN

## 2017-01-27 MED ORDER — DIPHENHYDRAMINE HCL 50 MG/ML IJ SOLN: 12.5000 mg | INTRAMUSCULAR | Status: DC | PRN

## 2017-01-27 MED ORDER — BENZOCAINE-MENTHOL 20-0.5 % EX AERO
1.0000 "application " | INHALATION_SPRAY | CUTANEOUS | Status: DC | PRN
  Administered 2017-01-27: 1 via TOPICAL
  Filled 2017-01-27: qty 56

## 2017-01-27 MED ORDER — ONDANSETRON HCL 4 MG/2ML IJ SOLN: 4.0000 mg | Freq: Four times a day (QID) | INTRAMUSCULAR | Status: DC | PRN

## 2017-01-27 NOTE — Progress Notes (Signed)
Patient ID: Barbara Mclean, female   DOB: 03/09/1996, 21 y.o.   MRN: [id number] Barbara Mclean is a 21 y.o. G2P1001 at 692w2d admitted for PROM  Subjective: Comfortable w/ epidural  Objective: BP 100/60   Pulse 76   Temp 98 F (36.7 C) (Oral)   Resp 18   Ht 5\' 5"  (1.651 m)   Wt 67.1 kg (148 lb)   LMP 05/19/2016 (LMP Unknown)   SpO2 98%   BMI 24.63 kg/m  No intake/output data recorded.  FHT:  FHR: 120 bpm, variability: moderate,  accelerations:  Present,  decelerations:  Present occ variable UC:   q 2-35mins  SVE:   Dilation: 3 Effacement (%): 80 Station: -2 Exam by:: s.faulk, rn  Pitocin @ 724mu/min  Labs: Lab Results  Component Value Date   WBC 7.2 01/27/2017   HGB 10.3 (L) 01/27/2017   HCT 30.9 (L) 01/27/2017   MCV 82.0 01/27/2017   PLT 267 01/27/2017    Assessment / Plan: IOL d/t PROM, s/p cytotec x 1, now on pitocin. Continue increasing to acheive adequate labor per protocol  Labor: latent phase Fetal Wellbeing:  Category II Pain Control:  Epidural Pre-eclampsia: n/a I/D:  GBS pcr neg, culture still pending from 5/23 Anticipated MOD:  NSVD  Marge DuncansBooker, Kimberly Randall CNM, WHNP-BC 01/27/2017, 1:33 PM

## 2017-01-27 NOTE — H&P (Signed)
LABOR ADMISSION HISTORY AND PHYSICAL  Barbara Mclean is a 21 y.o. female G2P1001 with IUP at [redacted]w[redacted]d by 15wk [place] presenting for ruptured membranes.  She reports +FM. Denies contractions and VB. Denies no blurry vision, headaches or peripheral edema, and RUQ pain.  She plans on breast feeding. She request IUD for birth control.  Dating: By 15 wk [place] --->  Estimated Date of Delivery: 02/15/17  Sono:    @[redacted]w[redacted]d , CWD, normal anatomy, cephalic presentation, 737g, 51% EFW   Prenatal History/Complications:  Past Medical History: Past Medical History:  Diagnosis Date  . Chlamydia   . IBS (irritable bowel syndrome)   . URI (upper respiratory infection) 11/06/14    Past Surgical History: Past Surgical History:  Procedure Laterality Date  . NO PAST SURGERIES      Obstetrical History: OB History    Gravida Para Term Preterm AB Living   2 1 1     1    SAB TAB Ectopic Multiple Live Births         0 1      Social History: Social History   Social History  . Marital status: Single    Spouse name: N/A  . Number of children: N/A  . Years of education: N/A   Social History Main Topics  . Smoking status: Former Smoker    Packs/day: 0.50    Types: Cigarettes    Quit date: 08/03/2014  . Smokeless tobacco: Never Used  . Alcohol use No     Comment: not now  . Drug use: No     Comment: marijuana use prior to pregnancy  . Sexual activity: Yes    Birth control/ protection: None   Other Topics Concern  . None   Social History Narrative  . None    Family History: Family History  Problem Relation Age of Onset  . Anemia Mother   . Diabetes Maternal Grandmother   . Hypertension Maternal Grandmother   . Cancer Maternal Grandfather        bone    Allergies: No Known Allergies  Prescriptions Prior to Admission  Medication Sig Dispense Refill Last Dose  . Pediatric Multiple Vit-C-FA (FLINSTONES GUMMIES OMEGA-3 DHA PO) Take by mouth. Takes 2 daily   Taking     Review of  Systems   All systems reviewed and negative except as stated in HPI  BP 124/81 (BP Location: Right Arm)   Pulse 80   Temp 97.4 F (36.3 C)   Resp 16   Ht 5\' 5"  (1.651 m)   Wt 148 lb (67.1 kg)   LMP 05/19/2016 (LMP Unknown)   BMI 24.63 kg/m  General appearance: alert, cooperative and no distress Lungs: clear to auscultation bilaterally Heart: regular rate and rhythm Abdomen: soft, non-tender; bowel sounds normal Extremities: no LE edema, no calf tenderness Presentation: cephalic Fetal monitoringBaseline: 120 bpm, Variability: Fair (1-6 bpm), Accelerations: Reactive and Decelerations: Absent Uterine activity Irregular Dilation: Fingertip (External Os 2 cm/ Internal Os fingertip) Effacement (%): Thick Station: -3 Exam by:: Barbara Budge, RN   Prenatal labs: ABO, Rh:   Antibody: Negative (03/28 0914) Rubella: !Error! Immune RPR: Non Reactive (03/28 0914)  HBsAg: Negative (12/21 1132)  HIV: Non Reactive (03/28 0914)  GBS:   Unknown (pending) 1 hr Glucola Passed Genetic screening  Neg Anatomy US Normal female  Prenatal Transfer Tool  Maternal Diabetes: No Genetic Screening: Normal Maternal Ultrasounds/Referrals: Normal Fetal Ultrasounds or other Referrals:  None Maternal Substance Abuse:  Yes:  Type: Smoker, Marijuana Significant  Maternal Medications:  None Significant Maternal Lab Results: None  No results found for this or any previous visit (from the past 24 hour(s)).  Patient Active Problem List   Diagnosis Date Noted  . Supervision of normal pregnancy 10/02/2016  . 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 Trichomonas infection 11/11/2014  . IBS (irritable bowel syndrome) 10/28/2014    Assessment: Barbara Mclean is a 21 y.o. G2P1001 at 374w2d here for IOL for PROM.   #Labor:Begin induction with cytotec.  #Pain: IV pain meds and epidural available upon maternal request #FWB: Cat 1 #ID:  GBS pending - will await results before beginning abx #MOF:  Breast #MOC:IUD #Circ:  Yes (outpt)  Barbara AbernethyAbigail J Lancaster, MD, MPH PGY-2 [facility]  CNM attestation:  I have seen and examined this patient; I agree with above documentation in the resident's note.   Barbara Mclean is a 21 y.o. G2P1001 here for SROM  PE: BP 112/70 (BP Location: Left Arm)   Pulse 77   Temp 98.1 F (36.7 C) (Oral)   Resp 16   Ht 5\' 5"  (1.651 m)   Wt 67.1 kg (148 lb)   LMP 05/19/2016 (LMP Unknown)   SpO2 99%   BMI 24.63 kg/m  Gen: calm comfortable, NAD Resp: normal effort, no distress Abd: gravid  ROS, labs, PMH reviewed  [facility] Expectant management w/ cytotec ripening Will collect GBS PCR (the GBS culture collected in the office doesn't appear to have been sent) Anticipate SVD  SHAW, KIMBERLY CNM 01/27/2017, 7:00 AM

## 2017-01-27 NOTE — Anesthesia Preprocedure Evaluation (Signed)
Anesthesia Evaluation  Patient identified by MRN, date of birth, ID band Patient awake    Reviewed: Allergy & Precautions, H&P , NPO status , Patient's Chart, lab work & pertinent test results  Airway Mallampati: I  TM Distance: >3 FB Neck ROM: full    Dental no notable dental hx.    Pulmonary former smoker,    Pulmonary exam normal breath sounds clear to auscultation       Cardiovascular negative cardio ROS Normal cardiovascular exam     Neuro/Psych negative neurological ROS  negative psych ROS   GI/Hepatic negative GI ROS, Neg liver ROS,   Endo/Other  negative endocrine ROS  Renal/GU negative Renal ROS  negative genitourinary   Musculoskeletal negative musculoskeletal ROS (+)   Abdominal Normal abdominal exam  (+)   Peds negative pediatric ROS (+)  Hematology  (+) Blood dyscrasia, anemia ,   Anesthesia Other Findings   Reproductive/Obstetrics (+) Pregnancy                             Anesthesia Physical Anesthesia Plan  ASA: II  Anesthesia Plan: Epidural   Post-op Pain Management:    Induction:   Airway Management Planned:   Additional Equipment:   Intra-op Plan:   Post-operative Plan:   Informed Consent: I have reviewed the patients History and Physical, chart, labs and discussed the procedure including the risks, benefits and alternatives for the proposed anesthesia with the patient or authorized representative who has indicated his/her understanding and acceptance.     Plan Discussed with:   Anesthesia Plan Comments:         Anesthesia Quick Evaluation

## 2017-01-27 NOTE — MAU Note (Signed)
Leaked fld x 2 this am since 0215. Having some ctxs on occ. Fluid has been clear

## 2017-01-27 NOTE — Lactation Note (Signed)
This note was copied from a baby's chart. Lactation Consultation Note  Patient Name: Boy Cassandria SanteeMontenee Lemen ZOXWR'UToday's Date: 01/27/2017 Reason for consult: Follow-up assessment;Infant < 6lbs   Follow up with mom of 5 hour old infant at request of RN. Attempted to latch infant to left breast in the football hold. He was on and off the breast for a few minutes and would not sustain latch. Mom with small breasts and semi compressible areola with small everted nipples. Colostrum flowing easily.   Mom is choosing to supplement via bottle, infant took 5 cc EBM via bottle and tolerated it well, mom did bottle feeding independently. He was still cueing to feed. # 16 NS had been given to mom by RN, showed mom how to apply. Infant latched and fed for 10 minutes and self detached. Mom denied pain with latch. Colostrum was in NS when infant came off.   Mom report she feels better that she can feed the baby. Enc mom to call out for feeding assistance as needed.    Maternal Data Formula Feeding for Exclusion: No Has patient been taught Hand Expression?: Yes Does the patient have breastfeeding experience prior to this delivery?: Yes  Feeding Feeding Type: Breast Fed Length of feed: 10 min  LATCH Score/Interventions Latch: Repeated attempts needed to sustain latch, nipple held in mouth throughout feeding, stimulation needed to elicit sucking reflex. Intervention(s): Skin to skin;Teach feeding cues;Waking techniques Intervention(s): Adjust position;Assist with latch;Breast massage;Breast compression  Audible Swallowing: A few with stimulation Intervention(s): Hand expression Intervention(s): Skin to skin;Hand expression;Alternate breast massage  Type of Nipple: Everted at rest and after stimulation  Comfort (Breast/Nipple): Soft / non-tender     Hold (Positioning): Assistance needed to correctly position infant at breast and maintain latch. Intervention(s): Breastfeeding basics reviewed;Support  Pillows;Position options;Skin to skin  LATCH Score: 7  Lactation Tools Discussed/Used Tools: Nipple Shields Nipple shield size: 16 Pump Review: Setup, frequency, and cleaning;Milk Storage Initiated by:: Reviewed Date initiated:: 01/27/17 (at 2 hours pp)   Consult Status Consult Status: Follow-up Date: 01/28/17 Follow-up type: In-patient    Silas FloodSharon S Odeth Bry 01/27/2017, 7:04 PM

## 2017-01-27 NOTE — Anesthesia Postprocedure Evaluation (Signed)
Anesthesia Post Note  Patient: Cassandria SanteeMontenee Schlemmer  Procedure(s) Performed: * No procedures listed *  Patient location during evaluation: Mother Baby Anesthesia Type: Epidural Level of consciousness: awake and alert and oriented Pain management: pain level controlled Vital Signs Assessment: post-procedure vital signs reviewed and stable Respiratory status: spontaneous breathing and nonlabored ventilation Cardiovascular status: stable Postop Assessment: no headache, patient able to bend at knees, no backache, no signs of nausea or vomiting, epidural receding and adequate PO intake Anesthetic complications: no        Last Vitals:  Vitals:   01/27/17 1615 01/27/17 1730  BP: 121/77 135/84  Pulse: 67 71  Resp: 20 18  Temp: 36.6 C 36.7 C    Last Pain:  Vitals:   01/27/17 1920  TempSrc:   PainSc: 0-No pain   Pain Goal: Patients Stated Pain Goal: 0 (01/27/17 0342)               Laban EmperorMalinova,Devonne Lalani Hristova

## 2017-01-27 NOTE — Progress Notes (Signed)
Patient ID: Barbara Mclean, female   DOB: 1996/03/31, 21 y.o.   MRN: [id number]  CTSP for prolonged decel x 7 mins to nadir of 80s after having oral cytotec within the hour; pt in Trendelenberg when HR returned to baseline after various position changes; cx now 2/80/-2 (was FT/thick). Given Terb also. Will obs over the next couple of hours and then eval for need for Pit augmentation.  Cam HaiSHAW, KIMBERLY CNM 01/27/2017 6:57 AM

## 2017-01-27 NOTE — Anesthesia Pain Management Evaluation Note (Signed)
  CRNA Pain Management Visit Note  Patient: Barbara Mclean, 21 y.o., female  "[facility] We have an anesthesia team available at all times to provide care throughout the hospital, including epidural management and anesthesia for C-section. I don't know your plan for the delivery whether it a natural birth, water birth, IV sedation, nitrous supplementation, doula or epidural, but we want to meet your pain goals."   1.Was your pain managed to your expectations on prior hospitalizations?   Yes   2.What is your expectation for pain management during this hospitalization?     Epidural  3.How can we help you reach that goal?Epidural  Record the patient's initial score and the patient's pain goal.   Pain: 5  Pain Goal: 10 [facility] wants you to be able to say your pain was always managed very well.  Rica RecordsICKELTON,Marcellus Pulliam 01/27/2017

## 2017-01-27 NOTE — MAU Provider Note (Signed)
S: Ms. Barbara Mclean is a 21 y.o. G2P1001 at 2249w2d  who presents to MAU today complaining of leaking of fluid since 0230. She denies vaginal bleeding. She denies contractions. She reports normal fetal movement.    O: BP 124/81 (BP Location: Right Arm)   Pulse 80   Temp 97.4 F (36.3 C)   Resp 16   Ht 5\' 5"  (1.651 m)   Wt 67.1 kg (148 lb)   LMP 05/19/2016 (LMP Unknown)   BMI 24.63 kg/m  GENERAL: Well-developed, well-nourished female in no acute distress.  HEAD: Normocephalic, atraumatic.  CHEST: Normal effort of breathing, regular heart rate ABDOMEN: Soft, nontender, gravid PELVIC: Normal external female genitalia. Vagina is pink and rugated. Cervix with normal contour, no lesions. Normal discharge.  (++) pooling.   Cervical exam:  Dilation: Fingertip (External Os 2 cm/ Internal Os fingertip) Effacement (%): Thick Cervical Position: Posterior Station: -3 Presentation: Vertex Exam by:: VenezuelaBrittany Bowen, RN   Fetal Monitoring: Baseline: 130 Variability: moderate Accelerations: present Decelerations: absent Contractions: 4-5 mins apart   A: SIUP at 5649w2d  SROM  GBS unknown - results pending  P: Admit to L&D Routine L&D orders  Raelyn Moraolitta Auburn Hester, CNM 01/27/2017, 4:37 AM

## 2017-01-27 NOTE — Progress Notes (Signed)
Barbara Mclean is a 21 y.o. G2P1001 at 88101w2d admitted for rupture of membranes  Subjective: Patient reporting more pain with contractions and ready for epidural.  Objective: BP 118/77   Pulse 86   Temp 98.6 F (37 C) (Oral)   Resp 18   Ht 5\' 5"  (1.651 m)   Wt 148 lb (67.1 kg)   LMP 05/19/2016 (LMP Unknown)   SpO2 99%   BMI 24.63 kg/m  No intake/output data recorded.  FHT:  FHR: 125 bpm, variability: moderate,  accelerations:  Present,  decelerations:  Absent UC:   regular, every 2-4 minutes  SVE:   Dilation: 3 Effacement (%): 80 Station: -2 Exam by:: Henderson NewcomerStephanie Faulk, RN  Labs: Lab Results  Component Value Date   WBC 7.2 01/27/2017   HGB 10.3 (L) 01/27/2017   HCT 30.9 (L) 01/27/2017   MCV 82.0 01/27/2017   PLT 267 01/27/2017    Assessment / Plan: IUP at term. SROM. GBS neg.  Plan: epidural for pain management. Recheck cervix after epidural. Plan pitocin if no cervical change.  Barbara Mclean SNM 01/27/2017, 11:09 AM

## 2017-01-27 NOTE — Anesthesia Procedure Notes (Signed)
Epidural Patient location during procedure: OB Start time: 01/27/2017 11:12 AM End time: 01/27/2017 11:16 AM  Staffing Anesthesiologist: Leilani AbleHATCHETT, Bionca Mckey Performed: anesthesiologist   Preanesthetic Checklist Completed: patient identified, surgical consent, pre-op evaluation, timeout performed, IV checked, risks and benefits discussed and monitors and equipment checked  Epidural Patient position: sitting Prep: site prepped and draped and DuraPrep Patient monitoring: continuous pulse ox and blood pressure Approach: midline Location: L3-L4 Injection technique: LOR air  Needle:  Needle type: Tuohy  Needle gauge: 17 G Needle length: 9 cm and 9 Needle insertion depth: 4 cm Catheter type: closed end flexible Catheter size: 19 Gauge Catheter at skin depth: 9 cm Test dose: negative and Other  Assessment Sensory level: T9 Events: blood not aspirated, injection not painful, no injection resistance, negative IV test and no paresthesia  Additional Notes Reason for block:procedure for pain

## 2017-01-27 NOTE — Lactation Note (Addendum)
This note was copied from a baby's chart. Lactation Consultation Note  Patient Name: Barbara Mclean UJWJX'BToday's Date: 01/27/2017 Reason for consult: Initial assessment;Infant < 6lbs (early term 37 2/7, SGA), now 2 hours old, weight at birth 5 lbs 10.7 oz. Mom attempted to latch baby, who has already BF once, but baby was too sleepy. Dad went back to doing STS, and I started mom pumping in initiation setting. Mom was fitted with 21 flanges, with a good fit. coconut oil also given to mom, to be used with pumping.  Mom was able to express 10 ml's of colostrum, which was capped and to be fed after next feeding around 6 pm. Mom and dad to decide what tool they want to use to supplement the baby with. HE was taught to mom, with a good return demonstration. Mom to feed at least every 3 hours, or with cues, followed by pumping for 15 minutes, followed with HE. LPI policy reviewed with parents, even this baby is early term and small, explaining that a lot of the information applies to their baby also. On exam of baby's mouth, he appears to have a very short, mid posterior , tight frenulum, Mom  is not reporting any latch discomfort at this time. This was not mentioned to the mom. She did have trouble latching her first child. .    Basic breastafeeding teaching and lactation services reviewed with mom. She knows to call for lactation with questions/conerns.   Maternal Data Formula Feeding for Exclusion: No Has patient been taught Hand Expression?: Yes Does the patient have breastfeeding experience prior to this delivery?: Yes  Feeding Feeding Type: Breast Fed Length of feed: 10 min  LATCH Score/Interventions Latch: Too sleepy or reluctant, no latch achieved, no sucking elicited. Intervention(s): Skin to skin;Teach feeding cues;Waking techniques  Audible Swallowing: None Intervention(s): Hand expression Intervention(s): Skin to skin;Hand expression  Type of Nipple: Everted at rest and after  stimulation (small but evert)  Comfort (Breast/Nipple): Soft / non-tender     Hold (Positioning): Assistance needed to correctly position infant at breast and maintain latch. Intervention(s): Skin to skin;Breastfeeding basics reviewed  LATCH Score: 5  Lactation Tools Discussed/Used Pump Review: Setup, frequency, and cleaning;Milk Storage;Other (comment) (hand expression and pump settings) Initiated by:: christe Nedra HaiLee, Rn IBCLC Date initiated:: 01/27/17 (at 2 hours pp)   Consult Status Consult Status: Follow-up Date: 01/28/17 Follow-up type: In-patient    Alfred LevinsLee, Sequoia Mincey Anne 01/27/2017, 5:50 PM

## 2017-01-28 LAB — CULTURE, BETA STREP (GROUP B ONLY): Strep Gp B Culture: NEGATIVE

## 2017-01-28 NOTE — Lactation Note (Signed)
This note was copied from a baby's chart. Lactation Consultation Note  Patient Name: Barbara Mclean Reason for consult: Follow-up assessment;Breast/nipple pain   Called to room at request of mom for questions. Mom was very frustrated and angry that her nipples are hurting. She is feeding infant at the breast with the # 16 NS and pumping with 21 flanges.   Gave mom her # 24 flanges to try.mom pumped for a few minutes and says pumping did feel better.  Enc mom to use lower pump setting if needed and then increase as needed. Mom reports it is painful when infant latched, Discussed trying # 20 NS with next feeding. Mom said ok and I gave her the bigger NS. Mom declined feeding assistance at this time due to visitors.   Comfort gels given with instructions for use and cleaning. REport to Wonda HornerSusie Burns, RN Enc mom to call out for feeding assistance as needed.    Maternal Data Has patient been taught Hand Expression?: Yes  Feeding Feeding Type: Breast Fed Length of feed: 10 min  LATCH Score/Interventions          Comfort (Breast/Nipple): Filling, red/small blisters or bruises, mild/mod discomfort  Problem noted: Mild/Moderate discomfort Interventions (Mild/moderate discomfort): Comfort gels        Lactation Tools Discussed/Used Pump Review: Setup, frequency, and cleaning Initiated by:: Reviewed   Consult Status Consult Status: Follow-up Date: 01/29/17 Follow-up type: In-patient    Silas FloodSharon S [name], 5:59 PM

## 2017-01-28 NOTE — Progress Notes (Signed)
CSW received consult for hx of marijuana use.  Referral was screened out due to the following: ~MOB had no documented substance use after initial prenatal visit/+UPT. ~MOB had no positive drug screens after initial prenatal visit/+UPT. ~Baby's UDS is negative.  Please consult CSW if current concerns arise or by MOB's request.  CSW will monitor CDS results.

## 2017-01-28 NOTE — Progress Notes (Signed)
Talked to md   Could not find current rubella status   md stated she was immune  Heywood BeneLaura Feng was spoken to

## 2017-01-28 NOTE — Progress Notes (Signed)
Post Partum Day 1  Subjective:  Barbara Mclean is a 21 y.o. [id number] 428w2d s/p NSVD.  No acute events overnight.  Pt denies problems with ambulating, voiding or po intake.  She denies nausea or vomiting.  Pain is well controlled.  She has had flatus. She has not had bowel movement.  Lochia Minimal.  Plan for birth control is IUD.  Method of Feeding: breast feeding  Objective: BP 107/67   Pulse 68   Temp 98.4 F (36.9 C) (Oral)   Resp 18   Ht 5\' 5"  (1.651 m)   Wt 67.1 kg (148 lb)   LMP 05/19/2016 (LMP Unknown)   SpO2 98%   Breastfeeding? Unknown   BMI 24.63 kg/m   Physical Exam:  General: alert, cooperative and no distress Chest: CTAB Heart: RRR no m/r/g Abdomen: soft, nontender Uterine Fundus: fundus firm below umbilicus DVT Evaluation: No evidence of DVT seen on physical exam. Extremities: no LE edema  Recent Labs  01/27/17 0445  HGB 10.3*  HCT 30.9*    Assessment/Plan:  ASSESSMENT: Barbara Mclean is a 21 y.o. G2P2002 9328w2d ppd #1 s/p NSVD doing well.   Continue current care   LOS: 1 day   Ivan AnchorsJohn Adeolu [facility] Medical Student 01/28/2017, 8:47 AM

## 2017-01-29 MED ORDER — IBUPROFEN 600 MG PO TABS: 600.0000 mg | ORAL_TABLET | Freq: Four times a day (QID) | ORAL | 0 refills | Status: DC

## 2017-01-29 MED ORDER — PRENATAL MULTIVITAMIN CH: 1.0000 | ORAL_TABLET | Freq: Every day | ORAL | 0 refills | Status: DC

## 2017-01-29 NOTE — Discharge Summary (Signed)
OB Discharge Summary     Patient Name: Barbara Mclean DOB: [birth year] MRN: [id number]  Date of admission: 5/26Cassandria Santee/2018 Delivering MD: Shawna ClampBOOKER, KIMBERLY R   Date of discharge: 01/29/2017  Admitting diagnosis: 37wks water broke Intrauterine pregnancy: 6736w2d     Secondary diagnosis:  Active Problems:   Ruptured membranes, prolonged  Additional problems:  Patient Active Problem List   Diagnosis Date Noted  . Ruptured membranes, prolonged 01/27/2017  . Supervision of normal pregnancy 10/02/2016  . 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 Trichomonas infection 11/11/2014  . IBS (irritable bowel syndrome) 10/28/2014       Discharge diagnosis: Term Pregnancy Delivered                                                                                                Post partum procedures:None  Augmentation: Pitocin and Cytotec  Complications: ROM>24 hours  Hospital course:  Onset of Labor With Vaginal Delivery     21 y.o. yo G2P2002 at 8736w2d was admitted in Latent Labor on 01/27/2017. Patient had an uncomplicated labor course as follows:  Membrane Rupture Time/Date: 2:00 AM ,01/27/2017   Intrapartum Procedures: Episiotomy: None [1]                                         Lacerations:  None [1]  Patient had a delivery of a Viable infant. 01/27/2017  Information for the patient's newborn:  Dorise HissHolley, Boy Mykal [[id number]][[id number]]  Delivery Method: Vag-Spont    Pateint had an uncomplicated postpartum course.  She is ambulating, tolerating a regular diet, passing flatus, and urinating well. Patient is discharged home in stable condition on 01/29/17.   Physical exam  Vitals:   01/28/17 0630 01/28/17 0941 01/28/17 1752 01/29/17 0500  BP: 107/67 110/66 107/67 114/75  Pulse: 68 80 75 (!) 50  Resp: 18 18 16 18   Temp: 98.4 F (36.9 C) 98.2 F (36.8 C) 97.5 F (36.4 C) 98.4 F (36.9 C)  TempSrc: Oral Oral Oral Oral  SpO2:      Weight:      Height:       General: alert, cooperative and no  distress Lochia: appropriate Uterine Fundus: firm Incision: N/A DVT Evaluation: No evidence of DVT seen on physical exam. Labs: Lab Results  Component Value Date   WBC 7.2 01/27/2017   HGB 10.3 (L) 01/27/2017   HCT 30.9 (L) 01/27/2017   MCV 82.0 01/27/2017   PLT 267 01/27/2017   CMP Latest Ref Rng & Units 12/31/2013  Glucose 70 - 99 mg/dL 92  BUN 6 - 23 mg/dL 7  Creatinine 7.820.50 - 9.561.10 mg/dL 2.130.85  Sodium 086137 - 578147 mEq/L 139  Potassium 3.7 - 5.3 mEq/L 3.5(L)  Chloride 96 - 112 mEq/L 102  CO2 19 - 32 mEq/L 24  Calcium 8.4 - 10.5 mg/dL 8.9    Discharge instruction: per After Visit Summary and "Baby and Me Booklet".  After visit meds:  Allergies as of 01/29/2017   No Known Allergies  Medication List    TAKE these medications   acetaminophen 500 MG tablet Commonly known as:  TYLENOL Take 500 mg by mouth daily as needed for headache.   FLINSTONES GUMMIES OMEGA-3 DHA PO Take 2 tablets by mouth daily.   ibuprofen 600 MG tablet Commonly known as:  ADVIL,MOTRIN Take 1 tablet (600 mg total) by mouth every 6 (six) hours.   VAGISIL ANTI-ITCH MEDICATED EX Apply 1 application topically as needed (IRRITATION).       Diet: routine diet  Activity: Advance as tolerated. Pelvic rest for 6 weeks.   Outpatient follow up:6 weeks Follow up Appt: Future Appointments Date Time Provider Department Center  02/01/2017 11:15 AM Cresenzo-Dishmon, Scarlette Calico, CNM [facility]   Follow up Visit:No Follow-up on file.  Postpartum contraception: IUD at follow up  Newborn Data: Live born female  Birth Weight: 5 lb 10.7 oz (2571 g) APGAR: 7, 9  Baby Feeding: Breast Disposition:home with mother   01/29/2017 Howard Pouch, MD

## 2017-01-29 NOTE — Lactation Note (Signed)
This note was copied from a baby's chart. Lactation Consultation Note  Patient Name: Boy Cassandria SanteeMontenee Wyrick AVWUJ'WToday's Date: 01/29/2017 Reason for consult: Follow-up assessment   With this mom of an early term, SGA baby, weight now 5 lbs 7 oz. Mom was breast feeding when I walked in the room . The baby was latched fair, but needed to be advanced deeper on the breast. I shoed mom how to compress her breast and nipple shield behind the nipple, and advance the baby until his nose touches her breast. Mom was taught that the baby can no transfer milk , and not just nipple suck and swallow air. Mom very receptive to teaching.  She is being discharged later today. [facility] [place] [place] [facility] [place].    Maternal Data    Feeding Feeding Type: Breast Fed Length of feed: 31 min  LATCH Score/Interventions Latch: Grasps breast easily, tongue down, lips flanged, rhythmical sucking. Intervention(s): Skin to skin;Teach feeding cues Intervention(s): Adjust position;Assist with latch  Audible Swallowing: A few with stimulation (no milk seen in teh shieldd prior to readjustment) Intervention(s): Skin to skin;Hand expression  Type of Nipple: Everted at rest and after stimulation  Comfort (Breast/Nipple): Soft / non-tender  Problem noted: Filling  Hold (Positioning): Assistance needed to correctly position infant at breast and maintain latch.  LATCH Score: 8  Lactation Tools Discussed/Used WIC Program:  ([place] [facility] [facility] [place]   Consult Status Consult Status: Complete Follow-up type: Call as needed    Alfred LevinsLee, Everett Ricciardelli Anne 01/29/2017, 11:24 AM

## 2017-01-29 NOTE — Progress Notes (Signed)
Post discharge chart review completed.  

## 2017-01-29 NOTE — Discharge Instructions (Signed)

## 2017-02-01 ENCOUNTER — Encounter: Payer: Medicaid Other | Admitting: Advanced Practice Midwife

## 2017-02-14 ENCOUNTER — Telehealth: Payer: Self-pay | Admitting: *Deleted

## 2017-02-14 NOTE — Telephone Encounter (Signed)
Informed by health dept that pt scored a 14 on her [place] depression assessment today. Spoke with pt about this and pt stated that she did not have any thoughts of harming herself or others. Verified with pt that [facility] would be seeing her on Monday. Pt stated that she did not feel that she needed to come into the office any sooner than June 27. Advised pt that if she started to feel like harming herself or others or if she felt that she needed to be seen sooner to call us back and we would work her in. Pt verbalized understanding.

## 2017-02-19 ENCOUNTER — Ambulatory Visit (INDEPENDENT_AMBULATORY_CARE_PROVIDER_SITE_OTHER): Payer: Medicaid Other | Admitting: Adult Health

## 2017-02-19 ENCOUNTER — Encounter: Payer: Self-pay | Admitting: Adult Health

## 2017-02-19 VITALS — BP 124/82 | HR 72 | Ht 65.0 in | Wt 130.0 lb

## 2017-02-19 DIAGNOSIS — F53 Postpartum depression: Secondary | ICD-10-CM

## 2017-02-19 DIAGNOSIS — O99345 Other mental disorders complicating the puerperium: Principal | ICD-10-CM

## 2017-02-19 MED ORDER — ESCITALOPRAM OXALATE 10 MG PO TABS: 10.0000 mg | ORAL_TABLET | Freq: Every day | ORAL | 6 refills | Status: DC

## 2017-02-19 NOTE — Progress Notes (Signed)
Subjective:     Patient ID: Barbara SanteeMontenee Mclean, female   DOB: 07-22-1996, 21 y.o.   MRN: [id number]  HPI Barbara Mclean is a [age] year old black female, [facility] in [facility] had elevated E[facility].She had vaginal delivery 01/27/17, baby boy, 5 lbs 10.7 oz over intact perineum, also has almost 23 year old daughter at home.She is breast feeding, and milk not as much as was at beginning.   Review of Systems Had elevated EPDS +down Reviewed past medical,surgical, social and family history. Reviewed medications and allergies.     Objective:   Physical Exam BP 124/82 (BP Location: Right Arm, Patient Position: Sitting, Cuff Size: Normal)   Pulse 72   Ht 5\' 5"  (1.651 m)   Wt 130 lb (59 kg)   Breastfeeding? Yes   BMI 21.63 kg/m  Skin warm and dry. Lungs: clear to ausculation bilaterally. Cardiovascular: regular rate and rhythm.   EPDS score 13.Denies depression but just down, denies being suicidal or homicidal, will try lexapro. Ask for help, rest when you can.   Assessment:     1. Postpartum depression       Plan:    Increase pumping, can try fenugreek  Meds ordered this encounter  Medications  . escitalopram (LEXAPRO) 10 MG tablet    Sig: Take 1 tablet (10 mg total) by mouth daily.    Dispense:  30 tablet    Refill:  6    Order Specific Question:   Supervising Provider    Answer:   Lazaro ArmsEURE, LUTHER H [[id number]]  Return  Next week as scheduled for postpartum visit

## 2017-02-19 NOTE — Patient Instructions (Signed)
Postpartum Depression and Baby Blues The postpartum period begins right after the birth of a baby. During this time, there is often a great amount of joy and excitement. It is also a time of many changes in the life of the parents. Regardless of how many times a mother gives birth, each child brings new challenges and dynamics to the family. It is not unusual to have feelings of excitement along with confusing shifts in moods, emotions, and thoughts. All mothers are at risk of developing postpartum depression or the "baby blues." These mood changes can occur right after giving birth, or they may occur many months after giving birth. The baby blues or postpartum depression can be mild or severe. Additionally, postpartum depression can go away rather quickly, or it can be a long-term condition. What are the causes? Raised hormone levels and the rapid drop in those levels are thought to be a main cause of postpartum depression and the baby blues. A number of hormones change during and after pregnancy. Estrogen and progesterone usually decrease right after the delivery of your baby. The levels of thyroid hormone and various cortisol steroids also rapidly drop. Other factors that play a role in these mood changes include major life events and genetics. What increases the risk? If you have any of the following risks for the baby blues or postpartum depression, know what symptoms to watch out for during the postpartum period. Risk factors that may increase the likelihood of getting the baby blues or postpartum depression include:  Having a personal or family history of depression.  Having depression while being pregnant.  Having premenstrual mood issues or mood issues related to oral contraceptives.  Having a lot of life stress.  Having marital conflict.  Lacking a social support network.  Having a baby with special needs.  Having health problems, such as diabetes.  What are the signs or  symptoms? Symptoms of baby blues include:  Brief changes in mood, such as going from extreme happiness to sadness.  Decreased concentration.  Difficulty sleeping.  Crying spells, tearfulness.  Irritability.  Anxiety.  Symptoms of postpartum depression typically begin within the first month after giving birth. These symptoms include:  Difficulty sleeping or excessive sleepiness.  Marked weight loss.  Agitation.  Feelings of worthlessness.  Lack of interest in activity or food.  Postpartum psychosis is a very serious condition and can be dangerous. Fortunately, it is rare. Displaying any of the following symptoms is cause for immediate medical attention. Symptoms of postpartum psychosis include:  Hallucinations and delusions.  Bizarre or disorganized behavior.  Confusion or disorientation.  How is this diagnosed? A diagnosis is made by an evaluation of your symptoms. There are no medical or lab tests that lead to a diagnosis, but there are various questionnaires that a health care provider may use to identify those with the baby blues, postpartum depression, or psychosis. Often, a screening tool called the Edinburgh Postnatal Depression Scale is used to diagnose depression in the postpartum period. How is this treated? The baby blues usually goes away on its own in 1-2 weeks. Social support is often all that is needed. You will be encouraged to get adequate sleep and rest. Occasionally, you may be given medicines to help you sleep. Postpartum depression requires treatment because it can last several months or longer if it is not treated. Treatment may include individual or group therapy, medicine, or both to address any social, physiological, and psychological factors that may play a role in the   depression. Regular exercise, a healthy diet, rest, and social support may also be strongly recommended. Postpartum psychosis is more serious and needs treatment right away.  Hospitalization is often needed. Follow these instructions at home:  Get as much rest as you can. Nap when the baby sleeps.  Exercise regularly. Some women find yoga and walking to be beneficial.  Eat a balanced and nourishing diet.  Do little things that you enjoy. Have a cup of tea, take a bubble bath, read your favorite magazine, or listen to your favorite music.  Avoid alcohol.  Ask for help with household chores, cooking, grocery shopping, or running errands as needed. Do not try to do everything.  Talk to people close to you about how you are feeling. Get support from your partner, family members, friends, or other new moms.  Try to stay positive in how you think. Think about the things you are grateful for.  Do not spend a lot of time alone.  Only take over-the-counter or prescription medicine as directed by your health care provider.  Keep all your postpartum appointments.  Let your health care provider know if you have any concerns. Contact a health care provider if: You are having a reaction to or problems with your medicine. Get help right away if:  You have suicidal feelings.  You think you may harm the baby or someone else. This information is not intended to replace advice given to you by your health care provider. Make sure you discuss any questions you have with your health care provider. Document Released: 05/25/2004 Document Revised: 01/27/2016 Document Reviewed: 06/02/2013 Elsevier Interactive Patient Education  2017 Elsevier Inc.  

## 2017-02-28 ENCOUNTER — Encounter: Payer: Self-pay | Admitting: Advanced Practice Midwife

## 2017-02-28 ENCOUNTER — Ambulatory Visit (INDEPENDENT_AMBULATORY_CARE_PROVIDER_SITE_OTHER): Payer: Medicaid Other | Admitting: Advanced Practice Midwife

## 2017-02-28 MED ORDER — SERTRALINE HCL 50 MG PO TABS: 50.0000 mg | ORAL_TABLET | Freq: Every day | ORAL | 6 refills | Status: DC

## 2017-02-28 NOTE — Progress Notes (Signed)
Barbara Mclean is a 21 y.o. who presents for a postpartum visit. She is 4 weeks postpartum following a spontaneous vaginal delivery. I have fully reviewed the prenatal and intrapartum course. The delivery was at 37.2 gestational weeks.IOL after PROM  Anesthesia: epidural. Postpartum course has been complicated by PPD. She was started on LExpro 02/19/17. It makes her feel spacey, only took it for a week.  . Baby's course has been uneventful. Baby is feeding by breast. Milk supply was low, advised to take Fenugreek. Bleeding: staining only. Bowel function is normal. Bladder function is normal. Patient is not sexually active. Contraception method is IUD. Postpartum depression screening: 12. See note.   Current Outpatient Prescriptions:  .  acetaminophen (TYLENOL) 500 MG tablet, Take 500 mg by mouth daily as needed for headache., Disp: , Rfl:  .  escitalopram (LEXAPRO) 10 MG tablet, Take 1 tablet (10 mg total) by mouth daily. (Patient not taking: Reported on 02/28/2017), Disp: 30 tablet, Rfl: 6  Review of Systems   Constitutional: Negative for fever and chills Eyes: Negative for visual disturbances Respiratory: Negative for shortness of breath, dyspnea Cardiovascular: Negative for chest pain or palpitations  Gastrointestinal: Negative for vomiting, diarrhea and constipation Genitourinary: Negative for dysuria and urgency Musculoskeletal: Negative for back pain, joint pain, myalgias  Neurological: Negative for dizziness and headaches    Objective:    There were no vitals filed for this visit. General:  alert, cooperative and no distress   Breasts:  negative  Lungs: clear to auscultation bilaterally  Heart:  regular rate and rhythm  Abdomen: Soft, nontender   Vulva:  normal  Vagina: normal vagina  Cervix:  closed  Corpus: Well involuted     Rectal Exam: no hemorrhoids        Assessment:    normal postpartum exam.  Plan:   1. Contraception: none 2. Follow up in:  Tomorrow for IUD  or  as needed.  3.  Zoloft 50 mg (take 1/2 for a few days)

## 2017-03-01 ENCOUNTER — Encounter: Payer: Self-pay | Admitting: Women's Health

## 2017-03-01 ENCOUNTER — Ambulatory Visit (INDEPENDENT_AMBULATORY_CARE_PROVIDER_SITE_OTHER): Payer: Medicaid Other | Admitting: Women's Health

## 2017-03-01 VITALS — BP 124/82 | HR 88 | Wt 130.0 lb

## 2017-03-01 DIAGNOSIS — Z3202 Encounter for pregnancy test, result negative: Secondary | ICD-10-CM

## 2017-03-01 DIAGNOSIS — Z30430 Encounter for insertion of intrauterine contraceptive device: Secondary | ICD-10-CM | POA: Diagnosis not present

## 2017-03-01 LAB — POCT URINE PREGNANCY: PREG TEST UR: NEGATIVE

## 2017-03-01 NOTE — Patient Instructions (Addendum)
NO SEX UNTIL AFTER YOU GET YOUR BIRTH CONTROL   Levonorgestrel intrauterine device (IUD) What is this medicine? LEVONORGESTREL IUD (LEE voe nor jes trel) is a contraceptive (birth control) device. The device is placed inside the uterus by a healthcare professional. It is used to prevent pregnancy. This device can also be used to treat heavy bleeding that occurs during your period. This medicine may be used for other purposes; ask your health care provider or pharmacist if you have questions. COMMON BRAND NAME(S): Kyleena, LILETTA, Mirena, Skyla What should I tell my health care provider before I take this medicine? They need to know if you have any of these conditions: -abnormal Pap smear -cancer of the breast, uterus, or cervix -diabetes -endometritis -genital or pelvic infection now or in the past -have more than one sexual partner or your partner has more than one partner -heart disease -history of an ectopic or tubal pregnancy -immune system problems -IUD in place -liver disease or tumor -problems with blood clots or take blood-thinners -seizures -use intravenous drugs -uterus of unusual shape -vaginal bleeding that has not been explained -an unusual or allergic reaction to levonorgestrel, other hormones, silicone, or polyethylene, medicines, foods, dyes, or preservatives -pregnant or trying to get pregnant -breast-feeding How should I use this medicine? This device is placed inside the uterus by a health care professional. Talk to your pediatrician regarding the use of this medicine in children. Special care may be needed. Overdosage: If you think you have taken too much of this medicine contact a poison control center or emergency room at once. NOTE: This medicine is only for you. Do not share this medicine with others. What if I miss a dose? This does not apply. Depending on the brand of device you have inserted, the device will need to be replaced every 3 to 5 years if you  wish to continue using this type of birth control. What may interact with this medicine? Do not take this medicine with any of the following medications: -amprenavir -bosentan -fosamprenavir This medicine may also interact with the following medications: -aprepitant -armodafinil -barbiturate medicines for inducing sleep or treating seizures -bexarotene -boceprevir -griseofulvin -medicines to treat seizures like carbamazepine, ethotoin, felbamate, oxcarbazepine, phenytoin, topiramate -modafinil -pioglitazone -rifabutin -rifampin -rifapentine -some medicines to treat HIV infection like atazanavir, efavirenz, indinavir, lopinavir, nelfinavir, tipranavir, ritonavir -St. John's wort -warfarin This list may not describe all possible interactions. Give your health care provider a list of all the medicines, herbs, non-prescription drugs, or dietary supplements you use. Also tell them if you smoke, drink alcohol, or use illegal drugs. Some items may interact with your medicine. What should I watch for while using this medicine? Visit your doctor or health care professional for regular check ups. See your doctor if you or your partner has sexual contact with others, becomes HIV positive, or gets a sexual transmitted disease. This product does not protect you against HIV infection (AIDS) or other sexually transmitted diseases. You can check the placement of the IUD yourself by reaching up to the top of your vagina with clean fingers to feel the threads. Do not pull on the threads. It is a good habit to check placement after each menstrual period. Call your doctor right away if you feel more of the IUD than just the threads or if you cannot feel the threads at all. The IUD may come out by itself. You may become pregnant if the device comes out. If you notice that the IUD has come out   use a backup birth control method like condoms and call your health care provider. Using tampons will not change the  position of the IUD and are okay to use during your period. This IUD can be safely scanned with magnetic resonance imaging (MRI) only under specific conditions. Before you have an MRI, tell your healthcare provider that you have an IUD in place, and which type of IUD you have in place. What side effects may I notice from receiving this medicine? Side effects that you should report to your doctor or health care professional as soon as possible: -allergic reactions like skin rash, itching or hives, swelling of the face, lips, or tongue -fever, flu-like symptoms -genital sores -high blood pressure -no menstrual period for 6 weeks during use -pain, swelling, warmth in the leg -pelvic pain or tenderness -severe or sudden headache -signs of pregnancy -stomach cramping -sudden shortness of breath -trouble with balance, talking, or walking -unusual vaginal bleeding, discharge -yellowing of the eyes or skin Side effects that usually do not require medical attention (report to your doctor or health care professional if they continue or are bothersome): -acne -breast pain -change in sex drive or performance -changes in weight -cramping, dizziness, or faintness while the device is being inserted -headache -irregular menstrual bleeding within first 3 to 6 months of use -nausea This list may not describe all possible side effects. Call your doctor for medical advice about side effects. You may report side effects to FDA at 1-800-FDA-1088. Where should I keep my medicine? This does not apply. NOTE: This sheet is a summary. It may not cover all possible information. If you have questions about this medicine, talk to your doctor, pharmacist, or health care provider.  2018 Elsevier/Gold Standard (2016-06-02 14:14:56)  

## 2017-03-01 NOTE — Progress Notes (Signed)
Barbara Mclean is a 21 y.o. [id number] female 4wks s/p SVB here for IUD insertion, however she had sex 02/27/17- although they 'did not finish'. Will not insert IUD today. Gave option of 10d from last sex w/ am bhcg/pm insertion vs. 14d from last sex w/ UPT. 10d falls on a Friday and wouldn't be able to do pm insertion, so wants to do 14d- which will be 7/10. Abstinence until IUD insertion. Return 7/10 for insertion.  Barbara Mclean, CNM, [facility] 03/01/2017 12:48 PM

## 2017-03-13 ENCOUNTER — Encounter: Payer: Self-pay | Admitting: Women's Health

## 2017-03-13 ENCOUNTER — Ambulatory Visit (INDEPENDENT_AMBULATORY_CARE_PROVIDER_SITE_OTHER): Payer: Medicaid Other | Admitting: Women's Health

## 2017-03-13 VITALS — BP 110/78 | HR 54 | Ht 65.0 in | Wt 129.5 lb

## 2017-03-13 DIAGNOSIS — Z30430 Encounter for insertion of intrauterine contraceptive device: Secondary | ICD-10-CM

## 2017-03-13 DIAGNOSIS — Z3202 Encounter for pregnancy test, result negative: Secondary | ICD-10-CM

## 2017-03-13 LAB — POCT URINE PREGNANCY: PREG TEST UR: NEGATIVE

## 2017-03-13 NOTE — Progress Notes (Signed)
Barbara Mclean is a 21 y.o. year old [id number] African American female who presents for placement of a Mirena IUD.  No LMP recorded. Patient is not currently having periods (Reason: Lactating). BP 110/78 (BP Location: Left Arm, Patient Position: Sitting, Cuff Size: Normal)   Pulse (!) 54   Ht 5\' 5"  (1.651 m)   Wt 129 lb 8 oz (58.7 kg)   Breastfeeding? Yes   BMI 21.55 kg/m  Last sexual intercourse was 6/28, and pregnancy test today was neg Last pap 12/19/14, neg  Results for orders placed or performed in visit on 03/13/17 (from the past 24 hour(s))  POCT urine pregnancy     Status: None   Collection Time: 03/13/17 10:10 AM  Result Value Ref Range   Preg Test, Ur Negative Negative     The risks and benefits of the method and placement have been thouroughly reviewed with the patient and all questions were answered.  Specifically the patient is aware of failure rate of 09/998, expulsion of the IUD and of possible perforation.  The patient is aware of irregular bleeding due to the method and understands the incidence of irregular bleeding diminishes with time.  Signed copy of informed consent in chart.   Time out was performed.  A graves speculum was placed in the vagina.  The cervix was visualized, prepped using Betadine, and grasped with a single tooth tenaculum. The uterus was found to be retroflexed and it sounded to 8 cm.  Mirena IUD placed per manufacturer's recommendations.   The strings were trimmed to 3 cm.  Sonogram was performed and the proper placement of the IUD was verified via transvaginal u/s.   The patient was given post procedure instructions, including signs and symptoms of infection and to check for the strings after each menses or each month, and refraining from intercourse or anything in the vagina for 3 days.  She was given a Mirena care card with date IUD placed, and date IUD to be removed.  She is scheduled for a f/u appointment in 4 weeks.  Marge DuncansBooker, Aidenjames Heckmann  Randall CNM, [facility]NP[facility] 03/13/2017 10:21 AM

## 2017-03-13 NOTE — Patient Instructions (Signed)
 Nothing in vagina for 3 days (no sex, douching, tampons, etc...)  Check your strings once a month to make sure you can feel them, if you are not able to please let us know  If you develop a fever of 100.4 or more in the next few weeks, or if you develop severe abdominal pain, please let us know  Use a backup method of birth control, such as condoms, for 2 weeks   Levonorgestrel intrauterine device (IUD) What is this medicine? LEVONORGESTREL IUD (LEE voe nor jes trel) is a contraceptive (birth control) device. The device is placed inside the uterus by a healthcare professional. It is used to prevent pregnancy. This device can also be used to treat heavy bleeding that occurs during your period. This medicine may be used for other purposes; ask your health care provider or pharmacist if you have questions. COMMON BRAND NAME(S): Kyleena, LILETTA, Mirena, Skyla What should I tell my health care provider before I take this medicine? They need to know if you have any of these conditions: -abnormal Pap smear -cancer of the breast, uterus, or cervix -diabetes -endometritis -genital or pelvic infection now or in the past -have more than one sexual partner or your partner has more than one partner -heart disease -history of an ectopic or tubal pregnancy -immune system problems -IUD in place -liver disease or tumor -problems with blood clots or take blood-thinners -seizures -use intravenous drugs -uterus of unusual shape -vaginal bleeding that has not been explained -an unusual or allergic reaction to levonorgestrel, other hormones, silicone, or polyethylene, medicines, foods, dyes, or preservatives -pregnant or trying to get pregnant -breast-feeding How should I use this medicine? This device is placed inside the uterus by a health care professional. Talk to your pediatrician regarding the use of this medicine in children. Special care may be needed. Overdosage: If you think you have  taken too much of this medicine contact a poison control center or emergency room at once. NOTE: This medicine is only for you. Do not share this medicine with others. What if I miss a dose? This does not apply. Depending on the brand of device you have inserted, the device will need to be replaced every 3 to 5 years if you wish to continue using this type of birth control. What may interact with this medicine? Do not take this medicine with any of the following medications: -amprenavir -bosentan -fosamprenavir This medicine may also interact with the following medications: -aprepitant -armodafinil -barbiturate medicines for inducing sleep or treating seizures -bexarotene -boceprevir -griseofulvin -medicines to treat seizures like carbamazepine, ethotoin, felbamate, oxcarbazepine, phenytoin, topiramate -modafinil -pioglitazone -rifabutin -rifampin -rifapentine -some medicines to treat HIV infection like atazanavir, efavirenz, indinavir, lopinavir, nelfinavir, tipranavir, ritonavir -St. John's wort -warfarin This list may not describe all possible interactions. Give your health care provider a list of all the medicines, herbs, non-prescription drugs, or dietary supplements you use. Also tell them if you smoke, drink alcohol, or use illegal drugs. Some items may interact with your medicine. What should I watch for while using this medicine? Visit your doctor or health care professional for regular check ups. See your doctor if you or your partner has sexual contact with others, becomes HIV positive, or gets a sexual transmitted disease. This product does not protect you against HIV infection (AIDS) or other sexually transmitted diseases. You can check the placement of the IUD yourself by reaching up to the top of your vagina with clean fingers to feel the threads. Do   not pull on the threads. It is a good habit to check placement after each menstrual period. Call your doctor right away if  you feel more of the IUD than just the threads or if you cannot feel the threads at all. The IUD may come out by itself. You may become pregnant if the device comes out. If you notice that the IUD has come out use a backup birth control method like condoms and call your health care provider. Using tampons will not change the position of the IUD and are okay to use during your period. This IUD can be safely scanned with magnetic resonance imaging (MRI) only under specific conditions. Before you have an MRI, tell your healthcare provider that you have an IUD in place, and which type of IUD you have in place. What side effects may I notice from receiving this medicine? Side effects that you should report to your doctor or health care professional as soon as possible: -allergic reactions like skin rash, itching or hives, swelling of the face, lips, or tongue -fever, flu-like symptoms -genital sores -high blood pressure -no menstrual period for 6 weeks during use -pain, swelling, warmth in the leg -pelvic pain or tenderness -severe or sudden headache -signs of pregnancy -stomach cramping -sudden shortness of breath -trouble with balance, talking, or walking -unusual vaginal bleeding, discharge -yellowing of the eyes or skin Side effects that usually do not require medical attention (report to your doctor or health care professional if they continue or are bothersome): -acne -breast pain -change in sex drive or performance -changes in weight -cramping, dizziness, or faintness while the device is being inserted -headache -irregular menstrual bleeding within first 3 to 6 months of use -nausea This list may not describe all possible side effects. Call your doctor for medical advice about side effects. You may report side effects to FDA at 1-800-FDA-1088. Where should I keep my medicine? This does not apply. NOTE: This sheet is a summary. It may not cover all possible information. If you have  questions about this medicine, talk to your doctor, pharmacist, or health care provider.  2018 Elsevier/Gold Standard (2016-06-02 14:14:56)  

## 2017-04-10 ENCOUNTER — Ambulatory Visit: Payer: Medicaid Other | Admitting: Women's Health

## 2017-08-31 ENCOUNTER — Ambulatory Visit: Payer: Medicaid Other | Admitting: Women's Health

## 2017-09-13 ENCOUNTER — Ambulatory Visit: Payer: Self-pay | Admitting: Women's Health

## 2018-02-15 NOTE — Telephone Encounter (Signed)
Preadmission screen
